# Patient Record
Sex: Female | Born: 1980
Health system: Southern US, Community
[De-identification: ages and names within clinical notes are randomized; demographics above are authoritative.]

## PROBLEM LIST (undated history)

## (undated) DIAGNOSIS — D649 Anemia, unspecified: Secondary | ICD-10-CM

## (undated) DIAGNOSIS — I10 Essential (primary) hypertension: Secondary | ICD-10-CM

## (undated) HISTORY — PX: BRAIN SURGERY: SHX531

## (undated) HISTORY — DX: Essential (primary) hypertension: I10

## (undated) HISTORY — DX: Anemia, unspecified: D64.9

---

## 2007-01-29 ENCOUNTER — Ambulatory Visit: Payer: Self-pay | Admitting: Family Medicine

## 2007-01-29 DIAGNOSIS — I10 Essential (primary) hypertension: Secondary | ICD-10-CM | POA: Insufficient documentation

## 2007-02-02 ENCOUNTER — Telehealth (INDEPENDENT_AMBULATORY_CARE_PROVIDER_SITE_OTHER): Payer: Self-pay | Admitting: *Deleted

## 2007-02-22 ENCOUNTER — Encounter (INDEPENDENT_AMBULATORY_CARE_PROVIDER_SITE_OTHER): Payer: Self-pay | Admitting: Family Medicine

## 2007-02-22 ENCOUNTER — Encounter: Admission: RE | Admit: 2007-02-22 | Discharge: 2007-05-17 | Payer: Self-pay | Admitting: Family Medicine

## 2007-02-22 ENCOUNTER — Encounter: Admission: RE | Admit: 2007-02-22 | Discharge: 2007-03-12 | Payer: Self-pay | Admitting: Family Medicine

## 2007-02-26 ENCOUNTER — Emergency Department (HOSPITAL_COMMUNITY): Admission: EM | Admit: 2007-02-26 | Discharge: 2007-02-27 | Payer: Self-pay | Admitting: Emergency Medicine

## 2007-04-02 ENCOUNTER — Ambulatory Visit: Payer: Self-pay | Admitting: Family Medicine

## 2007-04-06 LAB — CONVERTED CEMR LAB
BUN: 5 mg/dL — ABNORMAL LOW (ref 6–23)
CO2: 28 meq/L (ref 19–32)
Calcium: 9.3 mg/dL (ref 8.4–10.5)
Chloride: 105 meq/L (ref 96–112)
Creatinine, Ser: 0.8 mg/dL (ref 0.4–1.2)
Glucose, Bld: 96 mg/dL (ref 70–99)
Sodium: 137 meq/L (ref 135–145)

## 2007-04-07 ENCOUNTER — Telehealth (INDEPENDENT_AMBULATORY_CARE_PROVIDER_SITE_OTHER): Payer: Self-pay | Admitting: *Deleted

## 2007-04-16 ENCOUNTER — Telehealth (INDEPENDENT_AMBULATORY_CARE_PROVIDER_SITE_OTHER): Payer: Self-pay | Admitting: *Deleted

## 2007-04-16 ENCOUNTER — Ambulatory Visit: Payer: Self-pay | Admitting: Family Medicine

## 2007-04-16 ENCOUNTER — Encounter (INDEPENDENT_AMBULATORY_CARE_PROVIDER_SITE_OTHER): Payer: Self-pay | Admitting: *Deleted

## 2007-04-21 ENCOUNTER — Telehealth (INDEPENDENT_AMBULATORY_CARE_PROVIDER_SITE_OTHER): Payer: Self-pay | Admitting: *Deleted

## 2007-04-23 ENCOUNTER — Ambulatory Visit: Payer: Self-pay | Admitting: Family Medicine

## 2007-04-23 ENCOUNTER — Encounter: Admission: RE | Admit: 2007-04-23 | Discharge: 2007-04-23 | Payer: Self-pay | Admitting: Family Medicine

## 2007-04-23 ENCOUNTER — Telehealth (INDEPENDENT_AMBULATORY_CARE_PROVIDER_SITE_OTHER): Payer: Self-pay | Admitting: Family Medicine

## 2007-04-23 ENCOUNTER — Telehealth (INDEPENDENT_AMBULATORY_CARE_PROVIDER_SITE_OTHER): Payer: Self-pay | Admitting: *Deleted

## 2007-04-23 DIAGNOSIS — R079 Chest pain, unspecified: Secondary | ICD-10-CM | POA: Insufficient documentation

## 2007-04-23 LAB — CONVERTED CEMR LAB
BUN: 4 mg/dL — ABNORMAL LOW (ref 6–23)
CO2: 28 meq/L (ref 19–32)
GFR calc non Af Amer: 80 mL/min
Glucose, Bld: 89 mg/dL (ref 70–99)
Potassium: 3.5 meq/L (ref 3.5–5.1)

## 2007-06-25 ENCOUNTER — Ambulatory Visit: Payer: Self-pay | Admitting: Family Medicine

## 2007-06-25 ENCOUNTER — Encounter (INDEPENDENT_AMBULATORY_CARE_PROVIDER_SITE_OTHER): Payer: Self-pay | Admitting: *Deleted

## 2007-06-25 LAB — CONVERTED CEMR LAB
BUN: 5 mg/dL — ABNORMAL LOW (ref 6–23)
Calcium: 9.8 mg/dL (ref 8.4–10.5)
Cholesterol: 196 mg/dL (ref 0–200)
GFR calc non Af Amer: 80 mL/min
HDL: 43.6 mg/dL (ref >39.0)
LDL Cholesterol: 130 mg/dL — ABNORMAL HIGH (ref 0–99)
Potassium: 4.3 meq/L (ref 3.5–5.1)
Total CHOL/HDL Ratio: 4.5
Triglycerides: 110 mg/dL (ref 0–149)
VLDL: 22 mg/dL (ref 0–40)

## 2007-07-27 ENCOUNTER — Telehealth (INDEPENDENT_AMBULATORY_CARE_PROVIDER_SITE_OTHER): Payer: Self-pay | Admitting: Family Medicine

## 2007-07-30 ENCOUNTER — Telehealth (INDEPENDENT_AMBULATORY_CARE_PROVIDER_SITE_OTHER): Payer: Self-pay | Admitting: *Deleted

## 2007-07-30 ENCOUNTER — Encounter (INDEPENDENT_AMBULATORY_CARE_PROVIDER_SITE_OTHER): Payer: Self-pay | Admitting: Family Medicine

## 2007-08-02 ENCOUNTER — Ambulatory Visit: Payer: Self-pay | Admitting: Family Medicine

## 2007-08-03 ENCOUNTER — Encounter (INDEPENDENT_AMBULATORY_CARE_PROVIDER_SITE_OTHER): Payer: Self-pay | Admitting: Family Medicine

## 2007-08-13 ENCOUNTER — Ambulatory Visit: Payer: Self-pay | Admitting: Cardiology

## 2007-08-13 LAB — CONVERTED CEMR LAB
CO2: 30 meq/L (ref 19–32)
Chloride: 105 meq/L (ref 96–112)
GFR calc non Af Amer: 79 mL/min
Glucose, Bld: 131 mg/dL — ABNORMAL HIGH (ref 70–99)
Potassium: 3.4 meq/L — ABNORMAL LOW (ref 3.5–5.1)
Sodium: 140 meq/L (ref 135–145)
TSH: 1.23 microintl units/mL (ref 0.35–5.50)

## 2007-08-27 ENCOUNTER — Ambulatory Visit: Payer: Self-pay | Admitting: Cardiology

## 2007-08-27 LAB — CONVERTED CEMR LAB
CO2: 31 meq/L (ref 19–32)
GFR calc Af Amer: 96 mL/min
GFR calc non Af Amer: 79 mL/min
Glucose, Bld: 78 mg/dL (ref 70–99)
Sodium: 136 meq/L (ref 135–145)

## 2007-08-31 LAB — CONVERTED CEMR LAB: Pap Smear: NORMAL

## 2007-11-25 ENCOUNTER — Telehealth (INDEPENDENT_AMBULATORY_CARE_PROVIDER_SITE_OTHER): Payer: Self-pay | Admitting: *Deleted

## 2008-01-27 ENCOUNTER — Ambulatory Visit: Payer: Self-pay | Admitting: Internal Medicine

## 2008-01-27 DIAGNOSIS — B009 Herpesviral infection, unspecified: Secondary | ICD-10-CM | POA: Insufficient documentation

## 2008-11-17 ENCOUNTER — Ambulatory Visit: Payer: Self-pay | Admitting: Family Medicine

## 2008-11-17 DIAGNOSIS — E669 Obesity, unspecified: Secondary | ICD-10-CM | POA: Insufficient documentation

## 2008-11-20 LAB — CONVERTED CEMR LAB
Calcium: 9.1 mg/dL (ref 8.4–10.5)
Chloride: 106 meq/L (ref 96–112)
Creatinine, Ser: 0.9 mg/dL (ref 0.4–1.2)

## 2009-03-19 ENCOUNTER — Ambulatory Visit: Payer: Self-pay | Admitting: Family Medicine

## 2009-03-19 DIAGNOSIS — H9390 Unspecified disorder of ear, unspecified ear: Secondary | ICD-10-CM | POA: Insufficient documentation

## 2009-03-19 DIAGNOSIS — M549 Dorsalgia, unspecified: Secondary | ICD-10-CM | POA: Insufficient documentation

## 2009-03-22 ENCOUNTER — Ambulatory Visit: Payer: Self-pay | Admitting: Family Medicine

## 2009-03-23 ENCOUNTER — Telehealth (INDEPENDENT_AMBULATORY_CARE_PROVIDER_SITE_OTHER): Payer: Self-pay | Admitting: *Deleted

## 2009-03-23 LAB — CONVERTED CEMR LAB
Basophils Absolute: 0.1 10*3/uL (ref 0.0–0.1)
Basophils Relative: 0.8 % (ref 0.0–3.0)
HCT: 31.3 % — ABNORMAL LOW (ref 36.0–46.0)
Lymphocytes Relative: 33.2 % (ref 12.0–46.0)
MCHC: 30.3 g/dL (ref 30.0–36.0)
Monocytes Relative: 11.4 % (ref 3.0–12.0)
Neutro Abs: 4.2 10*3/uL (ref 1.4–7.7)
RBC: 4.37 M/uL (ref 3.87–5.11)
RDW: 17.4 % — ABNORMAL HIGH (ref 11.5–14.6)
WBC: 8 10*3/uL (ref 4.5–10.5)

## 2009-03-26 ENCOUNTER — Encounter: Payer: Self-pay | Admitting: Family Medicine

## 2009-03-26 ENCOUNTER — Ambulatory Visit: Payer: Self-pay | Admitting: Family Medicine

## 2009-04-06 ENCOUNTER — Telehealth: Payer: Self-pay | Admitting: Family Medicine

## 2009-04-09 ENCOUNTER — Ambulatory Visit: Payer: Self-pay | Admitting: Family Medicine

## 2009-04-09 DIAGNOSIS — D500 Iron deficiency anemia secondary to blood loss (chronic): Secondary | ICD-10-CM | POA: Insufficient documentation

## 2009-04-09 LAB — CONVERTED CEMR LAB: Hgb A: 98 % — ABNORMAL HIGH (ref 96.8–97.8)

## 2009-04-10 ENCOUNTER — Encounter (INDEPENDENT_AMBULATORY_CARE_PROVIDER_SITE_OTHER): Payer: Self-pay | Admitting: *Deleted

## 2009-04-10 ENCOUNTER — Telehealth (INDEPENDENT_AMBULATORY_CARE_PROVIDER_SITE_OTHER): Payer: Self-pay | Admitting: *Deleted

## 2009-04-10 LAB — CONVERTED CEMR LAB
Alkaline Phosphatase: 63 units/L (ref 39–117)
BUN: 9 mg/dL (ref 6–23)
Bilirubin, Direct: 0 mg/dL (ref 0.0–0.3)
CO2: 29 meq/L (ref 19–32)
Calcium: 9.1 mg/dL (ref 8.4–10.5)
Cholesterol: 171 mg/dL (ref 0–200)
GFR calc non Af Amer: 108.51 mL/min (ref >60)
Glucose, Bld: 79 mg/dL (ref 70–99)
HDL: 38.1 mg/dL — ABNORMAL LOW (ref >39.00)
Total Bilirubin: 0.6 mg/dL (ref 0.3–1.2)
Total CHOL/HDL Ratio: 4

## 2009-04-16 ENCOUNTER — Encounter (INDEPENDENT_AMBULATORY_CARE_PROVIDER_SITE_OTHER): Payer: Self-pay | Admitting: *Deleted

## 2009-04-16 LAB — CONVERTED CEMR LAB
Eosinophils Relative: 1.9 % (ref 0.0–5.0)
Folate: 15.8 ng/mL
Hemoglobin: 10.7 g/dL — ABNORMAL LOW (ref 12.0–15.0)
Lymphocytes Relative: 38.8 % (ref 12.0–46.0)
Lymphs Abs: 2.6 10*3/uL (ref 0.7–4.0)
Monocytes Absolute: 0.7 10*3/uL (ref 0.1–1.0)
Monocytes Relative: 11 % (ref 3.0–12.0)
Neutro Abs: 3.3 10*3/uL (ref 1.4–7.7)
RBC: 4.43 M/uL (ref 3.87–5.11)
Transferrin: 311 mg/dL (ref 212.0–360.0)
WBC: 6.8 10*3/uL (ref 4.5–10.5)

## 2009-04-18 ENCOUNTER — Telehealth (INDEPENDENT_AMBULATORY_CARE_PROVIDER_SITE_OTHER): Payer: Self-pay | Admitting: *Deleted

## 2009-05-18 ENCOUNTER — Ambulatory Visit: Admission: RE | Admit: 2009-05-18 | Discharge: 2009-05-18 | Payer: Self-pay | Admitting: Obstetrics and Gynecology

## 2009-05-18 ENCOUNTER — Ambulatory Visit: Payer: Self-pay | Admitting: Vascular Surgery

## 2009-05-18 ENCOUNTER — Encounter (INDEPENDENT_AMBULATORY_CARE_PROVIDER_SITE_OTHER): Payer: Self-pay | Admitting: Obstetrics and Gynecology

## 2009-05-29 ENCOUNTER — Encounter (INDEPENDENT_AMBULATORY_CARE_PROVIDER_SITE_OTHER): Payer: Self-pay | Admitting: Family Medicine

## 2009-05-29 ENCOUNTER — Encounter: Payer: Self-pay | Admitting: Internal Medicine

## 2009-05-29 ENCOUNTER — Ambulatory Visit: Admission: RE | Admit: 2009-05-29 | Discharge: 2009-05-29 | Payer: Self-pay | Admitting: Family Medicine

## 2009-06-11 ENCOUNTER — Ambulatory Visit (HOSPITAL_COMMUNITY): Admission: RE | Admit: 2009-06-11 | Discharge: 2009-06-11 | Payer: Self-pay | Admitting: Surgery

## 2009-06-29 ENCOUNTER — Telehealth (INDEPENDENT_AMBULATORY_CARE_PROVIDER_SITE_OTHER): Payer: Self-pay | Admitting: *Deleted

## 2009-07-24 ENCOUNTER — Ambulatory Visit (HOSPITAL_COMMUNITY): Admission: RE | Admit: 2009-07-24 | Discharge: 2009-07-24 | Payer: Self-pay | Admitting: Surgery

## 2009-09-25 ENCOUNTER — Ambulatory Visit: Payer: Self-pay | Admitting: Family Medicine

## 2009-10-01 LAB — CONVERTED CEMR LAB
Basophils Absolute: 0 10*3/uL (ref 0.0–0.1)
Eosinophils Absolute: 0.1 10*3/uL (ref 0.0–0.7)
Eosinophils Relative: 0.8 % (ref 0.0–5.0)
Iron: 89 ug/dL (ref 42–145)
MCHC: 32.4 g/dL (ref 30.0–36.0)
Monocytes Absolute: 0.7 10*3/uL (ref 0.1–1.0)
Monocytes Relative: 10.5 % (ref 3.0–12.0)
RDW: 16.9 % — ABNORMAL HIGH (ref 11.5–14.6)
Saturation Ratios: 22.7 % (ref 20.0–50.0)
WBC: 6.9 10*3/uL (ref 4.5–10.5)

## 2010-07-04 ENCOUNTER — Ambulatory Visit
Admission: RE | Admit: 2010-07-04 | Discharge: 2010-07-04 | Payer: Self-pay | Source: Home / Self Care | Attending: Family Medicine | Admitting: Family Medicine

## 2010-07-09 NOTE — Progress Notes (Signed)
Summary: NEEDS REFILL FOR GENERIC LOTENSIN  Phone Note Call from Patient Call back at [phone]==CELL   Caller: Patient Summary of Call: PATIENT NEEDS REFILL FOR THE GENERIC FOR [facility]IN [facility]  30 DAY SUPPLY OR CAN SHE GET A LARGER REFILL? Initial call taken by: Jerolyn Shin,  June 29, 2009 4:50 PM  Follow-up for Phone Call        pt informed rx faxed .Kandice Hams  June 29, 2009 5:02 PM  Follow-up by: Kandice Hams,  June 29, 2009 5:02 PM    Prescriptions: LOTENSIN HCT 20-25 MG  TABS (BENAZEPRIL-HYDROCHLOROTHIAZIDE) Take one tablet daily  #90 x 1   Entered by:   Kandice Hams   Authorized by:   Loreen Freud DO   Signed by:   Kandice Hams on 06/29/2009   Method used:   [facility]axed to ...       [facility] [phone]* (retail)       [address]       [place], [place]  [address]       Ph: [phone]       Fax: [phone]   RxID:   [id number]

## 2010-07-09 NOTE — Assessment & Plan Note (Signed)
Summary: talk about low RBC's//lch   Vital Signs:  Patient profile:   30 year old female Height:      67.5 inches Weight:      205.8 pounds BMI:     31.87 Pulse rate:   70 / minute BP sitting:   120 / 80  (left arm)  Vitals Entered By: Doristine Devoid (September 25, 2009 11:04 AM) CC: discuss low iron   History of Present Illness: 30 yo woman here today to discuss anemia.  went to a doctor to discuss HCG diet and blood work.  was told that her RBC count was 'up by 1' but she's not sure what that means.  admits to feeling light headed at the end of the day, no dizziness the rest of the day.  had stopped iron due to constipation.  restarted last week.  would like labs re-drawn.  Current Medications (verified): 1)  Lotensin Hct 20-25 Mg  Tabs (Benazepril-Hydrochlorothiazide) .... Take One Tablet Daily 2)  Valtrex 1 Gm  Tabs (Valacyclovir Hcl) .... One By Mouth Once Daily X 7 Days 3)  Ferrous Sulfate 325 (65 Fe) Mg Tbec (Ferrous Sulfate) .... Take One Tablet Daily 4)  Yasmin 28 3-0.03 Mg Tabs (Drospirenone-Ethinyl Estradiol) .... As Directed  Allergies (verified): No Known Drug Allergies  Past History:  Past Medical History: Hypertension Anemia  Review of Systems      See HPI  Physical Exam  General:  Well-developed,well-nourished,in no acute distress; alert,appropriate and cooperative throughout examination Eyes:  mild conjunctival pallor Lungs:  Normal respiratory effort, chest expands symmetrically. Lungs are clear to auscultation, no crackles or wheezes. Heart:  normal rate and no murmur.     Impression & Recommendations:  Problem # 1:  IRON DEFICIENCY ANEMIA SECONDARY TO BLOOD LOSS (ICD-280.0) Assessment Unchanged due for recheck on labs.  encouraged pt to continue iron and use OTC stool softeners as needed.  will follow. Her updated medication list for this problem includes:    Ferrous Sulfate 325 (65 Fe) Mg Tbec (Ferrous sulfate) .Marland Kitchen... Take one tablet  daily  Orders: Venipuncture (16109) TLB-CBC Platelet - w/Differential (85025-CBCD) TLB-IBC Pnl (Iron/FE;Transferrin) (83550-IBC)  Problem # 2:  OBESITY (ICD-278.00) Assessment: Unchanged pt has lost 21 lbs on weight watchers and w/ regular exercise.  applauded pt's efforts and encouraged her to continue.  Complete Medication List: 1)  Lotensin Hct 20-25 Mg Tabs (Benazepril-hydrochlorothiazide) .... Take one tablet daily 2)  Valtrex 1 Gm Tabs (Valacyclovir hcl) .... One by mouth once daily x 7 days 3)  Ferrous Sulfate 325 (65 Fe) Mg Tbec (Ferrous sulfate) .... Take one tablet daily 4)  Yasmin 28 3-0.03 Mg Tabs (Drospirenone-ethinyl estradiol) .... As directed  Patient Instructions: 1)  Plan on a physical in fall of 2011- don't eat before that appt 2)  Certainly call with any questions or concerns you may have 3)  Keep up the good work on diet and exercise- you look fantastic!!! 4)  We'll notify you of your lab work 5)  Happy Easter! Prescriptions: LOTENSIN HCT 20-25 MG  TABS (BENAZEPRIL-HYDROCHLOROTHIAZIDE) Take one tablet daily  #90 x 3   Entered and Authorized by:   Neena Rhymes MD   Signed by:   Neena Rhymes MD on 09/25/2009   Method used:   [facility]ically to        [facility] [phone]* (retail)       [address] [facility] [address] [facility]       [place],  [place]  [address]       Ph: [phone]       Fax: [phone]   RxID:   [id number]

## 2010-07-09 NOTE — Consult Note (Signed)
Summary: [facility]  [facility]   Imported By: Lanelle Bal 06/13/2009 12:45:56  _____________________________________________________________________  External Attachment:    Type:   Image     Comment:   External Document

## 2010-07-17 NOTE — Assessment & Plan Note (Signed)
Summary: check bp//lch   Vital Signs:  Patient profile:   30 year old female Weight:      214 pounds BMI:     33.14 Pulse rate:   110 / minute BP sitting:   130 / 80  (left arm)  Vitals Entered By: Doristine Devoid CMA (July 04, 2010 2:48 PM) CC: f/u on BP has been up and down since holiday    History of Present Illness: 30 yo woman here for elevated BPs.  sxs started in Nov w/ diastolic value of 110.  at [facility] BP was 160/110.  UC added Norvasc.  since adding Norvasc the highest diastolic # has been 97.  at night when pt is relaxed DBP is in the 70s.  will get 'cloudy- headed' when BP is elevated.  increased stress- sister's husband died after house burnt down, nephew nearly died.  pt admits to not taking care of self due to caring for sister.  Current Medications (verified): 1)  Lotensin Hct 20-12.5 Mg Tabs (Benazepril-Hydrochlorothiazide) .... 2 Tabs Daily 2)  Valtrex 1 Gm  Tabs (Valacyclovir Hcl) .... One By Mouth Once Daily X 7 Days 3)  Ferrous Sulfate 325 (65 Fe) Mg Tbec (Ferrous Sulfate) .... Take One Tablet Daily 4)  Yasmin 28 3-0.03 Mg Tabs (Drospirenone-Ethinyl Estradiol) .... As Directed 5)  Norvasc 10 Mg Tabs (Amlodipine Besylate) .... Take One Tablet Daily  Allergies (verified): No Known Drug Allergies  Review of Systems      See HPI  Physical Exam  General:  Well-developed,well-nourished,in no acute distress; alert,appropriate and cooperative throughout examination Head:  Normocephalic and atraumatic without obvious abnormalities. No apparent alopecia or balding. Eyes:  No corneal or conjunctival inflammation noted. EOMI. Perrla. Funduscopic exam benign, without hemorrhages, exudates or papilledema. Vision grossly normal. Neck:  No deformities, masses, or tenderness noted. Lungs:  Normal respiratory effort, chest expands symmetrically. Lungs are clear to auscultation, no crackles or wheezes. Heart:  normal rate and no murmur.   Pulses:  +2 carotid, radial,  DP Extremities:  no C/C/E   Impression & Recommendations:  Problem # 1:  HYPERTENSION (ICD-401.9) Assessment Deteriorated given pt's fluctuating BP and sxs will increase Benazepril component to 40 mg daily.  continue Norvasc.  as pt's stress level and weight decline will adjust meds as needed.  reviewed supportive care and red flags that should prompt return.  Pt expresses understanding and is in agreement w/ this plan. Her updated medication list for this problem includes:    Lotensin Hct 20-12.5 Mg Tabs (Benazepril-hydrochlorothiazide) .Marland Kitchen... 2 tabs daily    Norvasc 10 Mg Tabs (Amlodipine besylate) .Marland Kitchen... Take one tablet daily  Complete Medication List: 1)  Lotensin Hct 20-12.5 Mg Tabs (Benazepril-hydrochlorothiazide) .... 2 tabs daily 2)  Valtrex 1 Gm Tabs (Valacyclovir hcl) .... One by mouth once daily x 7 days 3)  Ferrous Sulfate 325 (65 Fe) Mg Tbec (Ferrous sulfate) .... Take one tablet daily 4)  Yasmin 28 3-0.03 Mg Tabs (Drospirenone-ethinyl estradiol) .... As directed 5)  Norvasc 10 Mg Tabs (Amlodipine besylate) .... Take one tablet daily  Patient Instructions: 1)  Follow up in 3-4 weeks to recheck your blood pressure 2)  When you pick up the new Lotensin- take 2 daily 3)  Continue the Norvasc 4)  Drink plenty of fluids 5)  Keep up the good work on your exercise- i'm proud of you! 6)  Hang in there!!! Prescriptions: NORVASC 10 MG TABS (AMLODIPINE BESYLATE) take one tablet daily  #30 x 3   Entered  and Authorized by:   Neena Rhymes MD   Signed by:   Neena Rhymes MD on 07/04/2010   Method used:   Electronically to        [facility] [phone]* (retail)       [address]       [place], [place]  [address]       Ph: [phone]       Fax: [phone]   RxID:   [id number] LOTENSIN HCT 20-12.5 MG TABS (BENAZEPRIL-HYDROCHLOROTHIAZIDE) 2 tabs daily  #60 x 3   Entered and Authorized by:   Neena Rhymes MD   Signed by:   Neena Rhymes MD on 07/04/2010   Method used:   Electronically to        [facility] [phone]* (retail)       [address]       [place], [place]  [address]       Ph: [phone]       Fax: [phone]   RxID:   [id number]    Orders Added: 1)  Est. Patient Level III [30160]

## 2010-08-02 ENCOUNTER — Ambulatory Visit: Payer: Self-pay | Admitting: Family Medicine

## 2010-08-05 ENCOUNTER — Encounter: Payer: Self-pay | Admitting: Family Medicine

## 2010-08-05 ENCOUNTER — Ambulatory Visit: Payer: Self-pay | Admitting: Family Medicine

## 2010-08-05 ENCOUNTER — Other Ambulatory Visit: Payer: Self-pay | Admitting: Family Medicine

## 2010-08-05 ENCOUNTER — Ambulatory Visit (INDEPENDENT_AMBULATORY_CARE_PROVIDER_SITE_OTHER): Payer: No Typology Code available for payment source | Admitting: Family Medicine

## 2010-08-05 DIAGNOSIS — I10 Essential (primary) hypertension: Secondary | ICD-10-CM

## 2010-08-05 DIAGNOSIS — D500 Iron deficiency anemia secondary to blood loss (chronic): Secondary | ICD-10-CM

## 2010-08-05 LAB — BASIC METABOLIC PANEL
BUN: 7 mg/dL (ref 6–23)
Creatinine, Ser: 0.8 mg/dL (ref 0.4–1.2)
GFR: 110.71 mL/min (ref >60.00)
Sodium: 138 mEq/L (ref 135–145)

## 2010-08-05 LAB — CBC WITH DIFFERENTIAL/PLATELET
Eosinophils Absolute: 0.1 10*3/uL (ref 0.0–0.7)
HCT: 41.6 % (ref 36.0–46.0)
Lymphs Abs: 2.2 10*3/uL (ref 0.7–4.0)
Monocytes Relative: 7.3 % (ref 3.0–12.0)
Neutro Abs: 3.9 10*3/uL (ref 1.4–7.7)
Neutrophils Relative %: 58 % (ref 43.0–77.0)
Platelets: 349 10*3/uL (ref 150.0–400.0)
RDW: 12.8 % (ref 11.5–14.6)
WBC: 6.7 10*3/uL (ref 4.5–10.5)

## 2010-08-05 LAB — IBC PANEL: Iron: 94 ug/dL (ref 42–145)

## 2010-08-15 NOTE — Assessment & Plan Note (Signed)
Summary: follow-up//ph   Vital Signs:  Patient profile:   30 year old female Height:      67.5 inches Weight:      215 pounds Temp:     98.1 degrees F oral Pulse rate:   92 / minute BP sitting:   160 / 66  (left arm)  Vitals Entered By: Jeremy Johann CMA (August 05, 2010 11:25 AM)  Serial Vital Signs/Assessments:  Time      Position  BP       Pulse  Resp  Temp     By                     138/70                         Frances Rhymes MD  CC: Follow-up visit BP check, recheck iron level   History of Present Illness: 30 yo woman here today for  HTN- last night BP was '115/70 something'.  reports  BP has been better since increasing Lotensin.  states that whenever she eats salt can feel BP increasing.  no CP, SOB, edema, visual changes.  'minor headaches'.  anemia- off iron and OCPs.  no longer having heavy periods.  would like iron rechecked today.  Current Medications (verified): 1)  Lotensin Hct 20-12.5 Mg Tabs (Benazepril-Hydrochlorothiazide) .... 2 Tabs Daily 2)  Ferrous Sulfate 325 (65 Fe) Mg Tbec (Ferrous Sulfate) .... Take One Tablet Daily 3)  Norvasc 10 Mg Tabs (Amlodipine Besylate) .... Take One Tablet Daily  Allergies (verified): No Known Drug Allergies  Review of Systems      See HPI  Physical Exam  General:  Well-developed,well-nourished,in no acute distress; alert,appropriate and cooperative throughout examination Head:  Normocephalic and atraumatic without obvious abnormalities. No apparent alopecia or balding. Eyes:  mild conjunctival pallor Neck:  No deformities, masses, or tenderness noted. Lungs:  Normal respiratory effort, chest expands symmetrically. Lungs are clear to auscultation, no crackles or wheezes. Heart:  normal rate and no murmur.   Pulses:  +2 carotid, radial, DP Extremities:  no C/C/E   Impression & Recommendations:  Problem # 1:  HYPERTENSION (ICD-401.9) Assessment Unchanged BP fairly well controlled.  pt seems to be  especially sensitive to salt.  continue current meds and f/u in 3 months.  pt to call sooner if BP elevated.  reviewed supportive care and red flags that should prompt return.  Pt expresses understanding and is in agreement w/ this plan. Her updated medication list for this problem includes:    Lotensin Hct 20-12.5 Mg Tabs (Benazepril-hydrochlorothiazide) .Marland Kitchen... 2 tabs daily    Norvasc 10 Mg Tabs (Amlodipine besylate) .Marland Kitchen... Take one tablet daily  Orders: Venipuncture (16109) TLB-BMP (Basic Metabolic Panel-BMET) (80048-METABOL) Specimen Handling (60454)  Problem # 2:  IRON DEFICIENCY ANEMIA SECONDARY TO BLOOD LOSS (ICD-280.0) Assessment: Unchanged pt no longer on OCPs or iron.  reports periods are now shorter and lighter.  will check labs to assess for ongoing need for iron. Her updated medication list for this problem includes:    Ferrous Sulfate 325 (65 Fe) Mg Tbec (Ferrous sulfate) .Marland Kitchen... Take one tablet daily  Orders: TLB-CBC Platelet - w/Differential (85025-CBCD) TLB-IBC Pnl (Iron/FE;Transferrin) (83550-IBC) Specimen Handling (09811)  Complete Medication List: 1)  Lotensin Hct 20-12.5 Mg Tabs (Benazepril-hydrochlorothiazide) .... 2 tabs daily 2)  Ferrous Sulfate 325 (65 Fe) Mg Tbec (Ferrous sulfate) .... Take one tablet daily 3)  Norvasc 10 Mg Tabs (Amlodipine besylate) .Marland KitchenMarland KitchenMarland Kitchen  Take one tablet daily  Patient Instructions: 1)  Please schedule a follow-up appointment in 3 months to recheck blood pressure. 2)  Continue your current meds.  Call me if your home blood pressures are consistently >140/90 3)  Limit your salt 4)  Keep up the good work on diet and exercise 5)  We'll notify you of your lab results 6)  Call with any questions or concerns 7)  Hang in there!!    Orders Added: 1)  Venipuncture [36415] 2)  TLB-BMP (Basic Metabolic Panel-BMET) [80048-METABOL] 3)  TLB-CBC Platelet - w/Differential [85025-CBCD] 4)  TLB-IBC Pnl (Iron/FE;Transferrin) [83550-IBC] 5)  Specimen  Handling [99000] 6)  Est. Patient Level III [40102]    Prevention & Chronic Care Immunizations   Influenza vaccine: Not documented    Tetanus booster: 12/21/2006: given   Tetanus booster due: 12/20/2016    Pneumococcal vaccine: Not documented  Other Screening   Pap smear: normal  (08/31/2007)   Pap smear due: 08/30/2008   Smoking status: never  (04/09/2009)  Hypertension   Last Blood Pressure: 160 / 66  (08/05/2010)   Serum creatinine: 0.8  (03/22/2009)   Serum potassium 3.8  (03/22/2009)  Self-Management Support :    Hypertension self-management support: Not documented

## 2010-08-29 LAB — CBC
HCT: 39.2 % (ref 36.0–46.0)
Hemoglobin: 12.9 g/dL (ref 12.0–15.0)
Platelets: 311 10*3/uL (ref 150–400)
RDW: 18.7 % — ABNORMAL HIGH (ref 11.5–15.5)
WBC: 5.3 10*3/uL (ref 4.0–10.5)

## 2010-10-22 NOTE — Assessment & Plan Note (Signed)
[facility]                            CARDIOLOGY OFFICE NOTE   JACALYNN, BUZZELL                      MRN:          [id number]  DATE:08/13/2007                            DOB:          1980-07-13    Ms. Flavell is a very pleasant 30 year old female who I am asked to  evaluate for chest pain and hypertension.  The patient has no prior  cardiac history.  She does exercise.  She denies any dyspnea on  exertion, orthopnea, PND, pedal edema, palpitations, presyncope,  syncope, or chest pain.  She has had hypertension for approximately 9  years.  Back in November, her Lotensin HCT, which had worked well  before, was discontinued.  She was having chest heaviness.  The pain had  been present for 2 weeks.  It was not pleuritic or positional nor was it  related to food.  There were no associated symptoms including no nausea,  vomiting, shortness breath, or diaphoresis.  She states the pain did  worsen when she took her bra off.  It ultimately resolved.  Her Lotensin  HCT was changed to Norvasc.  However, her blood pressure was elevated,  and she has had to take trips to the emergency room because of that.  She was subsequently changed from Norvasc to metoprolol 25 mg p.o.  daily.  However, her blood pressure continues to be high.  She therefore  on her own 2 weeks ago changed back to Lotensin HCT, and her blood  pressure has been controlled, with systolics in 120-130 range and  diastolics in the 60-70 range.  Because of her blood pressure and chest  pain, we were asked to further evaluate.   CURRENT MEDICATIONS:  1. Lotensin HCT 20/25 mg p.o. daily.  2. Berry juice.   ALLERGIES:  NO KNOWN DRUG ALLERGIES.   SOCIAL HISTORY:  She does not smoke nor does she consume alcohol.  There  is no drug use.   FAMILY HISTORY:  Significant for hypertension in her father, but there  is no coronary artery disease.   PAST MEDICAL HISTORY:  1. Hypertension.  2.  Mild hyperlipidemia.  3. There is no diabetes mellitus.   PAST SURGICAL HISTORY:  She has had previous cysts removed under her  arms bilaterally.  There are no other surgeries noted.   REVIEW OF SYSTEMS:  She denies any headaches or fevers, chills.  There  is no productive cough or hemoptysis.  There is no dysphagia or  odynophagia, melena, or hematochezia.  There is no dysuria or hematuria.  There is no rash or seizure activity.  There is orthopnea, PND, or pedal  edema.  There is no claudication noted.  She has noticed some tingling  in her left hand, but she attributes this to excessive use with counting  money, as she is an Product/process development scientist.  The remaining systems are negative.   PHYSICAL EXAMINATION:  GENERAL:  She is well-developed and somewhat  obese.  She is no acute distress at present.  She is mildly anxious.  VITAL SIGNS:  She is 215 pounds.  Blood pressure 164/98, and  her pulse  is 119.  BACK:  Normal.  HEENT:  Normal with normal eyelids.  NECK:  Supple with normal upstroke bilaterally, and there are no bruits  noted.  There is no jugular venous distention.  On palpation, there is  potential mild thyromegaly.  I cannot palpate nodules.  CHEST:  Clear to auscultation with normal expansion.  CARDIOVASCULAR:  Tachycardic rate and regular rhythm.  There is a 1/6  systolic ejection murmur at the left sternal border.  There is no S3 or  S4.  ABDOMEN:  Nontender, nondistended.  Positive bowel sounds.  No  hepatosplenomegaly.  No mass appreciated.  No abdominal bruit.  EXTREMITIES:  She has 2+ femoral pulses bilaterally and no bruits.  No  edema.  I could palpate no cords.  She has 2+ posterior tibial pulses  bilaterally.  NEUROLOGIC:  Grossly intact.   Her electrocardiogram shows sinus tachycardia, rate of 119.  There are  no significant ST changes.   DIAGNOSES:  1. Atypical chest pain.  Her symptoms are not cardiac in etiology.      There were persistent for 2 weeks, and then they  worsened with      removing her bra.  Electrocardiogram is normal.  Note, she does not      have exertional shortness of breath or chest pain.  Will not pursue      further ischemia evaluation.  2. Hypertension.  Her blood pressure apparently is much better      controlled now that she is on her Lotensin HCT.  She tracks her      blood pressure at home, and we will continue to monitor this.  She      states it is now in the 120-130 range systolic with a diastolic of      16-10.  We will check a basic metabolic panel to follow potassium      and renal function, and we will discontinue her amlodipine and      metoprolol.  I think if her blood pressure is adequately controlled      with the above medication, then this should be sufficient.  I do      not think it caused her chest pain.  Note, we will check a thyroid-      stimulating hormone, given her elevated heart rate and question      enlarged thyroid on exam.  I have asked her to follow up with Dr.      Blossom Hoops or Dr. Drue Novel for her blood pressure as well as further      evaluation of her thyroid.  3. Question enlarged thyroid on examination.  As per above, we have      asked her to follow up Dr. Drue Novel or Dr. Blossom Hoops concerning this      issue.   I have instructed her to return to see [place] on as-needed basis.  We are  correlating her cuff that she uses at home with ours today to see if it  is accurate.  Again, her blood pressure appears to be controlled on  Lotensin HCT.     Madolyn Frieze Jens Som, MD, [facility]  Electronically Signed    BSC/MedQ  DD: 08/13/2007  DT: 08/14/2007  Job #: [id number]   cc:   Leanne Chang, M.D.

## 2010-10-24 ENCOUNTER — Encounter: Payer: Self-pay | Admitting: Family Medicine

## 2010-11-01 ENCOUNTER — Other Ambulatory Visit: Payer: Self-pay | Admitting: Family Medicine

## 2010-11-01 ENCOUNTER — Telehealth: Payer: Self-pay | Admitting: Family Medicine

## 2010-11-01 NOTE — Telephone Encounter (Signed)
Rx sent 

## 2010-11-01 NOTE — Telephone Encounter (Signed)
[facility] - she needs refill benazipril

## 2010-11-08 ENCOUNTER — Encounter: Payer: No Typology Code available for payment source | Admitting: Family Medicine

## 2010-11-30 ENCOUNTER — Other Ambulatory Visit: Payer: Self-pay | Admitting: Family Medicine

## 2010-12-02 NOTE — Telephone Encounter (Signed)
Refill sent. Per centricity pt is due for CPX, sent note.

## 2010-12-26 ENCOUNTER — Other Ambulatory Visit: Payer: Self-pay | Admitting: Family Medicine

## 2010-12-26 NOTE — Telephone Encounter (Signed)
Refill sent, needs ov. 

## 2011-01-27 ENCOUNTER — Other Ambulatory Visit: Payer: Self-pay | Admitting: Family Medicine

## 2011-01-30 ENCOUNTER — Other Ambulatory Visit: Payer: Self-pay | Admitting: Family Medicine

## 2011-02-03 IMAGING — CT CT GUIDANCE NEEDLE PLACEMENT
2 of 3 series · 15 of 32 positions shown, 20 images · non-contrast
Comparison: None.

CLINICAL DATA: Recurrent cystic lesion cephalad to the urinary
bladder.

CT GUIDANCE NEEDLE ASPIRATION PELVIS

[Series 2: rtn ap without · axial · non-contrast · 0.74mm/px · z∈[-216,-26]mm · 7 of 52 slices shown, 12 images]
[im 7/52  soft-tissue]
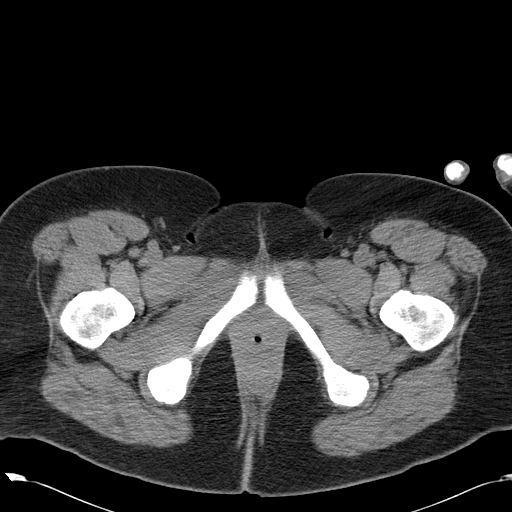
[im 7/52  bone]
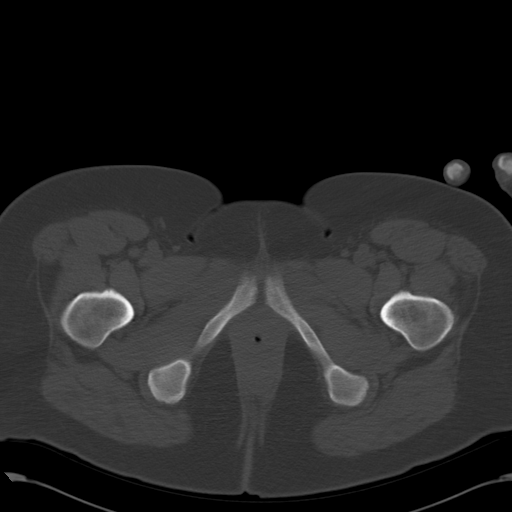
[im 13/52  soft-tissue]
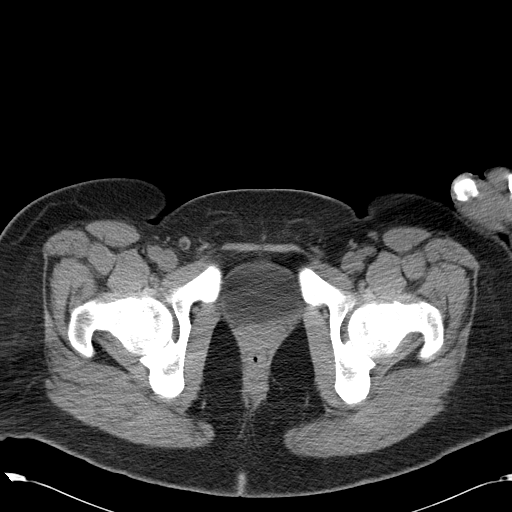
[im 20/52  soft-tissue]
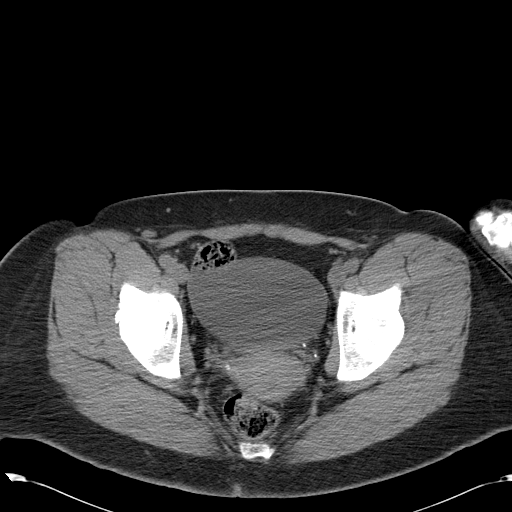
[im 26/52  soft-tissue]
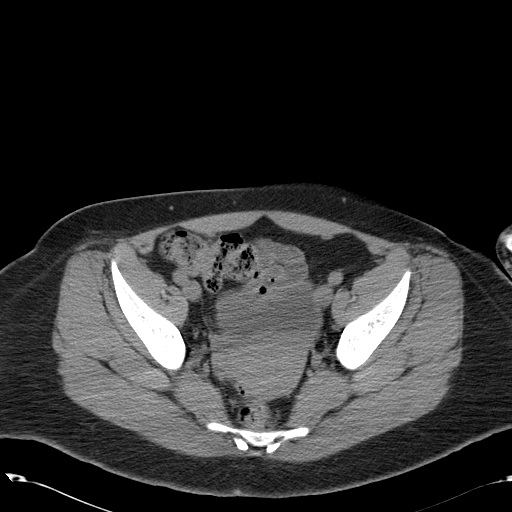
[im 26/52  lung]
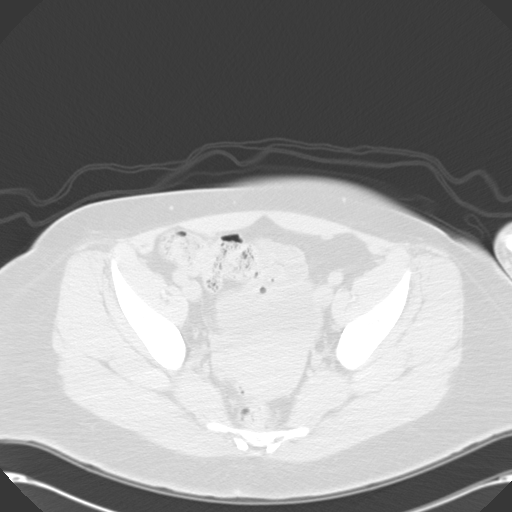
[im 32/52  soft-tissue]
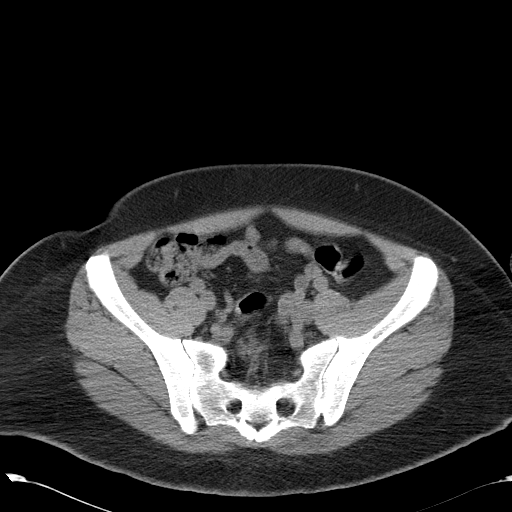
[im 32/52  lung]
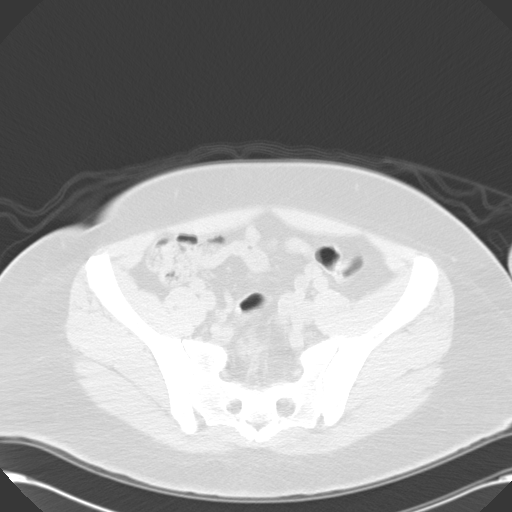
[im 39/52  soft-tissue]
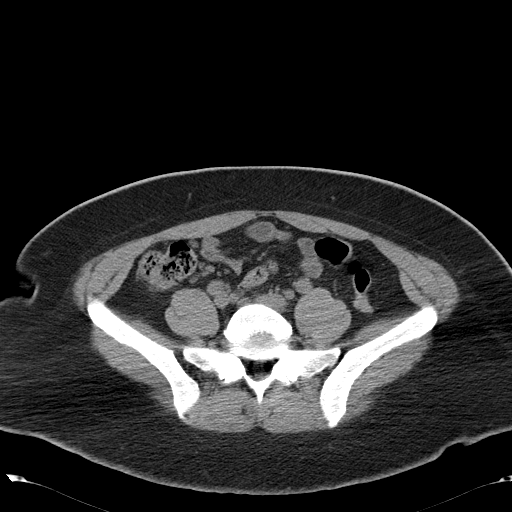
[im 39/52  lung]
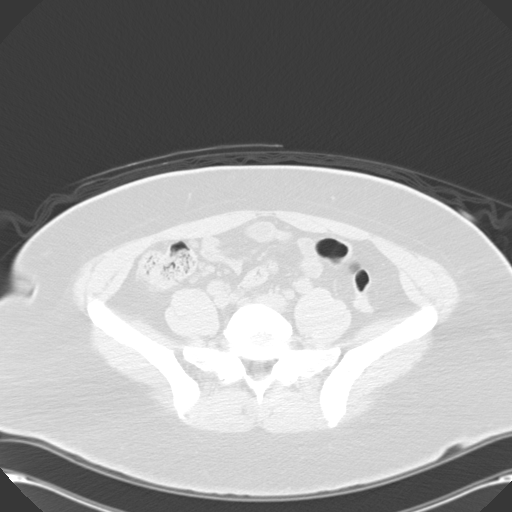
[im 45/52  soft-tissue]
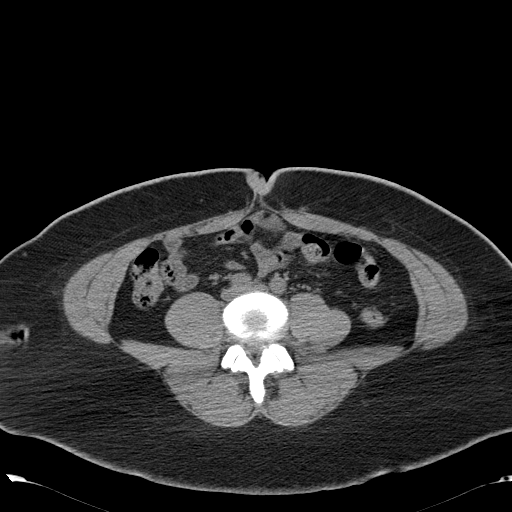
[im 45/52  lung]
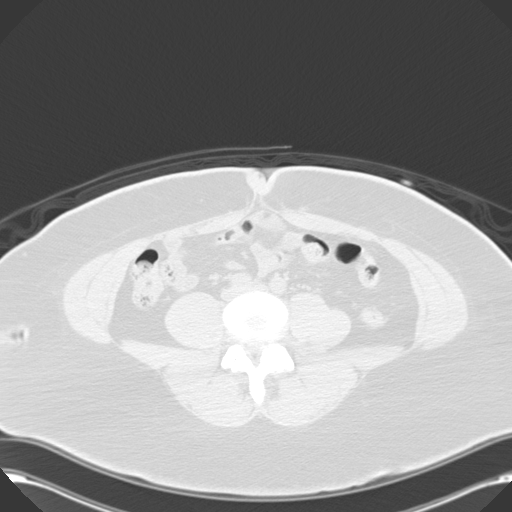

[Series 5: (hospital) 6.0 b30f · axial · 0.74mm/px · z∈[-155,-148]mm · 8 of 112 slices shown]
[im 11/112  soft-tissue]
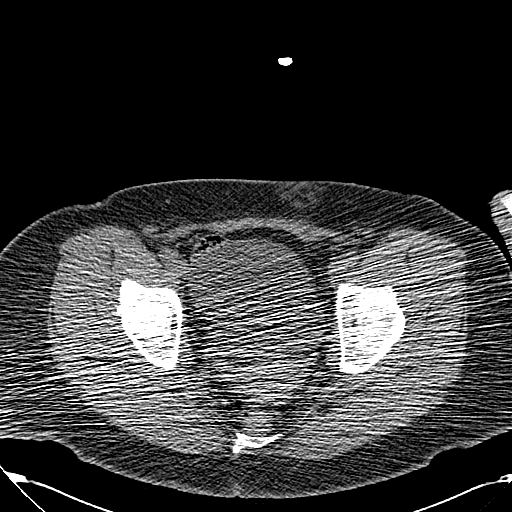
[im 22/112  soft-tissue]
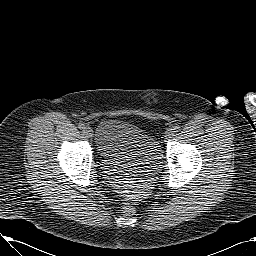
[im 38/112  soft-tissue]
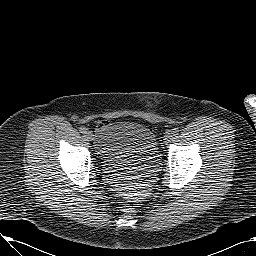
[im 48/112  soft-tissue]
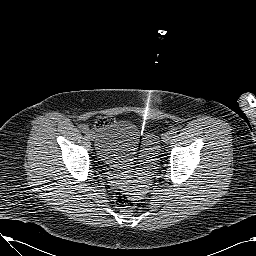
[im 64/112  soft-tissue]
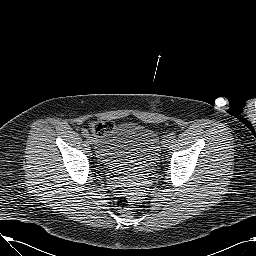
[im 75/112  soft-tissue]
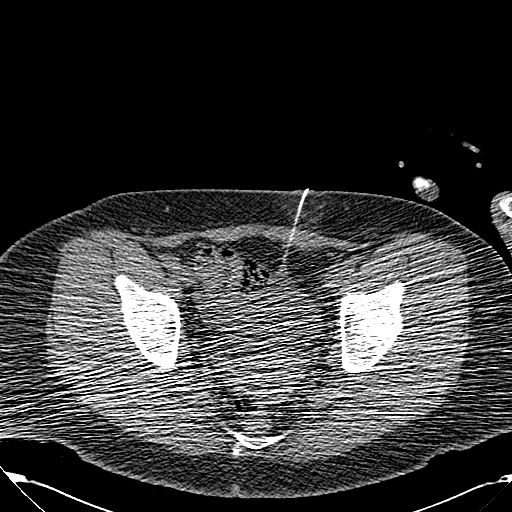
[im 90/112  soft-tissue]
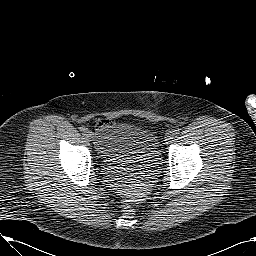
[im 101/112  soft-tissue]
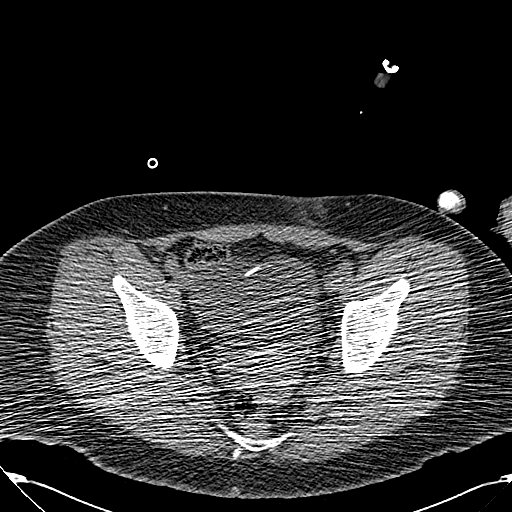

[15 of 32 positions shown; findings below may reference images not displayed]

Technique and findings,:  Survey scans through the lower pelvis
were obtained and the the cystic structure cephalad to the urinary
bladder was localized.  An appropriate skin entry site was
determined.   Site was marked, prepped with Betadine, draped in
usual sterile fashion, infiltrated locally with 1% lidocaine.
Intravenous Fentanyl and Versed were administered as conscious
sedation during continuous cardiorespiratory monitoring by the
radiology RN, with a total moderate sedation time of 15 minutes.
Under CT fluoroscopic guidance, a 10 cm 5-Jean Elie Fael Peeway multi side-
hole sheath needle was advanced to the lesion.  The needle tip
would pass into the lesion but the Francinilson Tidre would not advance
into the collection.  For this reason, a 19 gauge percutaneous
entry needle was advanced under CT fluoroscopy into the collection.
An Amplatz guide wire advanced into the collection, its position
confirmed on CT fluoroscopy.  Over this, a 15 cm 5-French multi
Bakhrul Kadali   Balderramo was advanced.  Approximately 200 ml of
clear pale yellow fluid were aspirated, sent for cytology analysis.
Final images showed decompression of the collection. The patient
tolerated the procedure well. No immediate complication
IMPRESSION: Technically successful CT-guided aspiration of cystic pelvic
lesion.

## 2011-02-25 ENCOUNTER — Other Ambulatory Visit: Payer: Self-pay | Admitting: Family Medicine

## 2011-02-28 ENCOUNTER — Other Ambulatory Visit: Payer: Self-pay | Admitting: Family Medicine

## 2011-03-24 ENCOUNTER — Ambulatory Visit (INDEPENDENT_AMBULATORY_CARE_PROVIDER_SITE_OTHER): Payer: No Typology Code available for payment source | Admitting: Family Medicine

## 2011-03-24 ENCOUNTER — Other Ambulatory Visit: Payer: Self-pay | Admitting: Family Medicine

## 2011-03-24 ENCOUNTER — Encounter: Payer: Self-pay | Admitting: Family Medicine

## 2011-03-24 DIAGNOSIS — I10 Essential (primary) hypertension: Secondary | ICD-10-CM

## 2011-03-24 DIAGNOSIS — Z0000 Encounter for general adult medical examination without abnormal findings: Secondary | ICD-10-CM | POA: Insufficient documentation

## 2011-03-24 LAB — CBC WITH DIFFERENTIAL/PLATELET
Basophils Absolute: 0 10*3/uL (ref 0.0–0.1)
Basophils Relative: 0.5 % (ref 0.0–3.0)
Eosinophils Absolute: 0.1 10*3/uL (ref 0.0–0.7)
Eosinophils Relative: 1 % (ref 0.0–5.0)
HCT: 42.5 % (ref 36.0–46.0)
Lymphocytes Relative: 30 % (ref 12.0–46.0)
MCV: 85.7 fl (ref 78.0–100.0)
Neutro Abs: 4.4 10*3/uL (ref 1.4–7.7)
RDW: 13.1 % (ref 11.5–14.6)
WBC: 7.1 10*3/uL (ref 4.5–10.5)

## 2011-03-24 LAB — HEPATIC FUNCTION PANEL
Alkaline Phosphatase: 78 U/L (ref 39–117)
Bilirubin, Direct: 0 mg/dL (ref 0.0–0.3)
Total Protein: 7.8 g/dL (ref 6.0–8.3)

## 2011-03-24 LAB — BASIC METABOLIC PANEL
CO2: 26 mEq/L (ref 19–32)
Calcium: 9.5 mg/dL (ref 8.4–10.5)
Chloride: 104 mEq/L (ref 96–112)
Creatinine, Ser: 0.8 mg/dL (ref 0.4–1.2)
GFR: 111.91 mL/min (ref >60.00)

## 2011-03-24 LAB — LIPID PANEL
HDL: 43.9 mg/dL (ref >39.00)
LDL Cholesterol: 120 mg/dL — ABNORMAL HIGH (ref 0–99)
Total CHOL/HDL Ratio: 4
VLDL: 27.4 mg/dL (ref 0.0–40.0)

## 2011-03-24 MED ORDER — BENAZEPRIL-HYDROCHLOROTHIAZIDE 20-12.5 MG PO TABS: 1.0000 | ORAL_TABLET | Freq: Every day | ORAL | Status: DC

## 2011-03-24 MED ORDER — AMLODIPINE BESYLATE 10 MG PO TABS: 10.0000 mg | ORAL_TABLET | Freq: Every day | ORAL | Status: DC

## 2011-03-24 NOTE — Patient Instructions (Signed)
Schedule your complete physical at your convenience- you can eat since we'll have the labs We'll notify you of your lab results You look great!  Keep up the good work! Call with any questions or concerns Happy Halloween!!!

## 2011-03-24 NOTE — Assessment & Plan Note (Signed)
Labs collected for upcoming CPE 

## 2011-03-24 NOTE — Progress Notes (Signed)
Addended by: Beverely Low on: 03/24/2011 03:16 PM   Modules accepted: Orders

## 2011-03-24 NOTE — Progress Notes (Signed)
  Subjective:    Patient ID: Frances Randolph, female    DOB: Jul 29, 1980, 30 y.o.   MRN: [id number]  HPI HTN- chronic problem for pt.  Reports last night BP was 111/60s.  Taking norvasc and Lotensin HCT.  No CP, SOB, HAs, visual changes, edema.   Review of Systems For ROS see HPI     Objective:   Physical Exam  Vitals reviewed. Constitutional: She is oriented to person, place, and time. She appears well-developed and well-nourished. No distress.  HENT:  Head: Normocephalic and atraumatic.  Eyes: Conjunctivae and EOM are normal. Pupils are equal, round, and reactive to light.  Neck: Normal range of motion. Neck supple. No thyromegaly present.  Cardiovascular: Normal rate, regular rhythm, normal heart sounds and intact distal pulses.   No murmur heard. Pulmonary/Chest: Effort normal and breath sounds normal. No respiratory distress.  Abdominal: Soft. She exhibits no distension. There is no tenderness.  Musculoskeletal: She exhibits no edema.  Lymphadenopathy:    She has no cervical adenopathy.  Neurological: She is alert and oriented to person, place, and time.  Skin: Skin is warm and dry.  Psychiatric: She has a normal mood and affect. Her behavior is normal.          Assessment & Plan:

## 2011-03-24 NOTE — Assessment & Plan Note (Signed)
Ongoing issue for pt.  Fair control in office, excellent control out of office.  Asymptomatic.  No med changes.

## 2011-03-24 NOTE — Progress Notes (Signed)
Quick Note:    Labs mailed.  ______

## 2011-03-27 LAB — VITAMIN D 1,25 DIHYDROXY: Vitamin D2 1, 25 (OH)2: <8 pg/mL

## 2011-06-13 ENCOUNTER — Encounter: Payer: No Typology Code available for payment source | Admitting: Family Medicine

## 2011-10-30 ENCOUNTER — Other Ambulatory Visit: Payer: Self-pay | Admitting: Family Medicine

## 2011-10-30 MED ORDER — BENAZEPRIL-HYDROCHLOROTHIAZIDE 20-12.5 MG PO TABS: 1.0000 | ORAL_TABLET | Freq: Every day | ORAL | Status: DC

## 2011-10-30 NOTE — Telephone Encounter (Signed)
rx sent to pharmacy by e-script for #30 Letter has been mailed to pt address noted in the chart to advise they are overdue for cpe/ov/labs and the pt needs to contact office to set up appt

## 2011-11-28 ENCOUNTER — Other Ambulatory Visit: Payer: Self-pay

## 2011-11-28 MED ORDER — BENAZEPRIL-HYDROCHLOROTHIAZIDE 20-12.5 MG PO TABS: 1.0000 | ORAL_TABLET | Freq: Every day | ORAL | Status: DC

## 2011-11-28 MED ORDER — AMLODIPINE BESYLATE 10 MG PO TABS: 10.0000 mg | ORAL_TABLET | Freq: Every day | ORAL | Status: DC

## 2011-11-28 NOTE — Telephone Encounter (Signed)
Call from patient and she requested  A refill on BP meds. Patient is due for a CPE and I scheduled it for the 1st avail according to the patient's schedule at the time which is august 16 at 10:30. Rx faxed to the pharmacy       KP

## 2011-12-15 ENCOUNTER — Other Ambulatory Visit: Payer: Self-pay | Admitting: *Deleted

## 2011-12-15 MED ORDER — BENAZEPRIL-HYDROCHLOROTHIAZIDE 20-12.5 MG PO TABS: 2.0000 | ORAL_TABLET | Freq: Every day | ORAL | Status: DC

## 2011-12-15 NOTE — Telephone Encounter (Signed)
[facility] Morrie Sheldon called to advise the pt is out of her benazapril-hctz per pt notes that she is taking 2 tablets daily but current directions note 1 time daily, unable to locate a change of dosage in last OV noted 03-24-11, MD Tabori reviewed pt chart and noted ok for pt to take 2 tablets daily per noted increase in tablets given on the OV 10-12 to #60, gave verbal order to give pt benazapril-hctz 20-12.5mg  take TWO tablets daily, #60 with 3 refills changed in chart as phone in, MD Tabori made aware verbally

## 2012-01-23 ENCOUNTER — Encounter: Payer: Self-pay | Admitting: Family Medicine

## 2012-01-23 ENCOUNTER — Ambulatory Visit (INDEPENDENT_AMBULATORY_CARE_PROVIDER_SITE_OTHER): Payer: No Typology Code available for payment source | Admitting: Family Medicine

## 2012-01-23 VITALS — BP 126/82 | HR 87 | Temp 98.5°F | Ht 67.0 in | Wt 199.6 lb

## 2012-01-23 DIAGNOSIS — B353 Tinea pedis: Secondary | ICD-10-CM

## 2012-01-23 DIAGNOSIS — Z0000 Encounter for general adult medical examination without abnormal findings: Secondary | ICD-10-CM

## 2012-01-23 DIAGNOSIS — I10 Essential (primary) hypertension: Secondary | ICD-10-CM

## 2012-01-23 LAB — HEPATIC FUNCTION PANEL
ALT: 20 U/L (ref 0–35)
Alkaline Phosphatase: 59 U/L (ref 39–117)
Bilirubin, Direct: 0 mg/dL (ref 0.0–0.3)
Total Bilirubin: 0.5 mg/dL (ref 0.3–1.2)

## 2012-01-23 LAB — CBC WITH DIFFERENTIAL/PLATELET
Basophils Absolute: 0 10*3/uL (ref 0.0–0.1)
Eosinophils Absolute: 0.1 10*3/uL (ref 0.0–0.7)
Lymphocytes Relative: 27.8 % (ref 12.0–46.0)
MCHC: 32 g/dL (ref 30.0–36.0)
Monocytes Relative: 7.7 % (ref 3.0–12.0)
Neutro Abs: 4 10*3/uL (ref 1.4–7.7)
Neutrophils Relative %: 63.1 % (ref 43.0–77.0)
Platelets: 316 10*3/uL (ref 150.0–400.0)
RDW: 13.6 % (ref 11.5–14.6)

## 2012-01-23 LAB — BASIC METABOLIC PANEL
CO2: 24 mEq/L (ref 19–32)
Chloride: 104 mEq/L (ref 96–112)
Creatinine, Ser: 0.8 mg/dL (ref 0.4–1.2)
Potassium: 3.6 mEq/L (ref 3.5–5.1)
Sodium: 138 mEq/L (ref 135–145)

## 2012-01-23 LAB — LIPID PANEL
LDL Cholesterol: 106 mg/dL — ABNORMAL HIGH (ref 0–99)
VLDL: 15.2 mg/dL (ref 0.0–40.0)

## 2012-01-23 MED ORDER — CLOTRIMAZOLE-BETAMETHASONE 1-0.05 % EX CREA: TOPICAL_CREAM | Freq: Two times a day (BID) | CUTANEOUS | Status: DC

## 2012-01-23 NOTE — Patient Instructions (Addendum)
Follow up in 6 months to recheck blood pressure We'll notify you of your lab results You look great!!!!  Keep up the good work!!! Apply the Lotrisone cream twice daily to your feet and ankles Call with any questions or concerns Enjoy the rest of your summer!!!

## 2012-01-23 NOTE — Progress Notes (Signed)
  Subjective:    Patient ID: Frances Randolph, female    DOB: [birth year], 31 y.o.   MRN: [id number]  HPI CPE- UTD w/ GYN, has lost 17 lbs since Oct  Skin lesions- occuring on both feet and ankles.  Noticed after pedicure.  Denies itching.  Circular and raised.  Worried about ringworm.  Has not tried OTC meds.   Review of Systems Patient reports no vision/ hearing changes, adenopathy,fever, weight change,  persistant/recurrent hoarseness , swallowing issues, chest pain, palpitations, edema, persistant/recurrent cough, hemoptysis, dyspnea (rest/exertional/paroxysmal nocturnal), gastrointestinal bleeding (melena, rectal bleeding), abdominal pain, significant heartburn, bowel changes, GU symptoms (dysuria, hematuria, incontinence), Gyn symptoms (abnormal  bleeding, pain),  syncope, focal weakness, memory loss, numbness & tingling, hair/nail changes, abnormal bruising or bleeding, anxiety, or depression.     Objective:   Physical Exam General Appearance:    Alert, cooperative, no distress, appears stated age  Head:    Normocephalic, without obvious abnormality, atraumatic  Eyes:    PERRL, conjunctiva/corneas clear, EOM's intact, fundi    benign, both eyes  Ears:    Normal TM's and external ear canals, both ears  Nose:   Nares normal, septum midline, mucosa normal, no drainage    or sinus tenderness  Throat:   Lips, mucosa, and tongue normal; teeth and gums normal  Neck:   Supple, symmetrical, trachea midline, no adenopathy;    Thyroid: no enlargement/tenderness/nodules  Back:     Symmetric, no curvature, ROM normal, no CVA tenderness  Lungs:     Clear to auscultation bilaterally, respirations unlabored  Chest Wall:    No tenderness or deformity   Heart:    Regular rate and rhythm, S1 and S2 normal, no murmur, rub   or gallop  Breast Exam:    Deferred to GYN  Abdomen:     Soft, non-tender, bowel sounds active all four quadrants,    no masses, no organomegaly  Genitalia:    Deferred to GYN    Rectal:    Extremities:   Extremities normal, atraumatic, no cyanosis or edema  Pulses:   2+ and symmetric all extremities  Skin:   Skin color, texture, turgor normal, scattered circular areas on dorsum of feet bilaterally and ankles consistent w/ ringworm  Lymph nodes:   Cervical, supraclavicular, and axillary nodes normal  Neurologic:   CNII-XII intact, normal strength, sensation and reflexes    throughout          Assessment & Plan:

## 2012-01-23 NOTE — Assessment & Plan Note (Signed)
Pt's PE WNL.  UTD on GYN screening.  Check labs.  Anticipatory guidance provided. 

## 2012-01-23 NOTE — Assessment & Plan Note (Signed)
BP well controlled.  Asymptomatic.  Applauded pt's weight loss.  Will continue to follow.

## 2012-01-23 NOTE — Assessment & Plan Note (Signed)
New.  Pt's PE consistent w/ ringworm.  Start combo steroid/antifungal to both tx infxn and decrease hyperpigmentation.  Reviewed supportive care and red flags that should prompt return.  Pt expressed understanding and is in agreement w/ plan.

## 2012-01-26 ENCOUNTER — Encounter: Payer: Self-pay | Admitting: *Deleted

## 2012-03-26 ENCOUNTER — Other Ambulatory Visit: Payer: Self-pay | Admitting: Family Medicine

## 2012-03-26 MED ORDER — AMLODIPINE BESYLATE 10 MG PO TABS: 10.0000 mg | ORAL_TABLET | Freq: Every day | ORAL | Status: DC

## 2012-03-26 NOTE — Telephone Encounter (Signed)
refill Amlodipine Besylate 10mg  tab #30 wt/2-refills take 1 tablet bt mouth daily last fill 9.22.13 last ov 8.16.13 V70

## 2012-03-26 NOTE — Telephone Encounter (Signed)
refill Amlodipine Besylate 10mg  tab #30 wt/2-refills take 1 tablet bt mouth daily last fill 9.22.13 last ov 8.16.13 V70     Plz advise   MW

## 2012-07-05 ENCOUNTER — Other Ambulatory Visit: Payer: Self-pay | Admitting: *Deleted

## 2012-07-05 DIAGNOSIS — I10 Essential (primary) hypertension: Secondary | ICD-10-CM

## 2012-07-05 MED ORDER — BENAZEPRIL-HYDROCHLOROTHIAZIDE 20-12.5 MG PO TABS: 2.0000 | ORAL_TABLET | Freq: Every day | ORAL | Status: DC

## 2012-07-05 NOTE — Telephone Encounter (Signed)
[facility] per pt request

## 2012-07-13 ENCOUNTER — Other Ambulatory Visit: Payer: Self-pay | Admitting: *Deleted

## 2012-07-13 DIAGNOSIS — I10 Essential (primary) hypertension: Secondary | ICD-10-CM

## 2012-07-13 MED ORDER — AMLODIPINE BESYLATE 10 MG PO TABS: 10.0000 mg | ORAL_TABLET | Freq: Every day | ORAL | Status: DC

## 2012-07-13 NOTE — Telephone Encounter (Signed)
Patient came to office stating pharmacy has problem with directions for med refill. [facility] spoke with pharmacist gave verbal order to refill Lotensin and Norvasc.

## 2012-07-30 ENCOUNTER — Ambulatory Visit: Payer: No Typology Code available for payment source | Admitting: Family Medicine

## 2012-07-30 DIAGNOSIS — Z0289 Encounter for other administrative examinations: Secondary | ICD-10-CM

## 2012-11-19 ENCOUNTER — Other Ambulatory Visit: Payer: Self-pay | Admitting: Family Medicine

## 2012-12-30 ENCOUNTER — Other Ambulatory Visit: Payer: Self-pay | Admitting: Family Medicine

## 2013-02-18 ENCOUNTER — Other Ambulatory Visit: Payer: Self-pay | Admitting: Family Medicine

## 2013-02-22 NOTE — Telephone Encounter (Signed)
Pt made aware of an office visit needs to be schedule. Rx filled #14, 0 refiils . SW, CMA

## 2013-03-18 ENCOUNTER — Ambulatory Visit (INDEPENDENT_AMBULATORY_CARE_PROVIDER_SITE_OTHER): Payer: No Typology Code available for payment source | Admitting: Family Medicine

## 2013-03-18 ENCOUNTER — Encounter: Payer: Self-pay | Admitting: Family Medicine

## 2013-03-18 VITALS — BP 130/86 | HR 70 | Temp 98.3°F | Resp 16 | Wt 208.0 lb

## 2013-03-18 DIAGNOSIS — E010 Iodine-deficiency related diffuse (endemic) goiter: Secondary | ICD-10-CM | POA: Insufficient documentation

## 2013-03-18 DIAGNOSIS — E049 Nontoxic goiter, unspecified: Secondary | ICD-10-CM

## 2013-03-18 DIAGNOSIS — B353 Tinea pedis: Secondary | ICD-10-CM

## 2013-03-18 DIAGNOSIS — I10 Essential (primary) hypertension: Secondary | ICD-10-CM

## 2013-03-18 LAB — BASIC METABOLIC PANEL
BUN: 13 mg/dL (ref 6–23)
Calcium: 9.4 mg/dL (ref 8.4–10.5)
Potassium: 3.8 mEq/L (ref 3.5–5.3)
Sodium: 136 mEq/L (ref 135–145)

## 2013-03-18 MED ORDER — AMLODIPINE BESYLATE 10 MG PO TABS: 10.0000 mg | ORAL_TABLET | Freq: Every day | ORAL | Status: DC

## 2013-03-18 MED ORDER — BENAZEPRIL-HYDROCHLOROTHIAZIDE 20-12.5 MG PO TABS: ORAL_TABLET | ORAL | Status: DC

## 2013-03-18 MED ORDER — CLOTRIMAZOLE-BETAMETHASONE 1-0.05 % EX CREA: TOPICAL_CREAM | Freq: Two times a day (BID) | CUTANEOUS | Status: DC

## 2013-03-18 NOTE — Patient Instructions (Signed)
Schedule your complete physical in 6 months We'll notify you of your lab results and make any changes if needed We'll notify you of your ultrasound results Call with any questions or concerns Happy Fall!!

## 2013-03-18 NOTE — Progress Notes (Signed)
  Subjective:    Patient ID: Frances Randolph, female    DOB: 11-16-1980, 32 y.o.   MRN: [id number]  HPI HTN- chronic problem, on Norvasc, Benazepril-HCTZ.  No CP, SOB, HAs, visual changes, edema.  Initially had some swelling after starting the Norvasc if she was wearing heels.   Review of Systems For ROS see HPI     Objective:   Physical Exam  Vitals reviewed. Constitutional: She is oriented to person, place, and time. She appears well-developed and well-nourished. No distress.  HENT:  Head: Normocephalic and atraumatic.  Eyes: Conjunctivae and EOM are normal. Pupils are equal, round, and reactive to light.  Neck: Normal range of motion. Neck supple. Thyromegaly (diffuse w/out nodularity) present.  Cardiovascular: Normal rate, regular rhythm, normal heart sounds and intact distal pulses.   No murmur heard. Pulmonary/Chest: Effort normal and breath sounds normal. No respiratory distress.  Abdominal: Soft. She exhibits no distension. There is no tenderness.  Musculoskeletal: She exhibits no edema.  Lymphadenopathy:    She has no cervical adenopathy.  Neurological: She is alert and oriented to person, place, and time.  Skin: Skin is warm and dry.  Psychiatric: She has a normal mood and affect. Her behavior is normal.          Assessment & Plan:

## 2013-03-19 LAB — TSH: TSH: 1.285 u[IU]/mL (ref 0.350–4.500)

## 2013-03-20 NOTE — Assessment & Plan Note (Signed)
New.  Check labs.  Get US to assess. 

## 2013-03-20 NOTE — Assessment & Plan Note (Signed)
Refill ointment.

## 2013-03-20 NOTE — Assessment & Plan Note (Signed)
Chronic problem, adequate control.  Asymptomatic.  Check labs.  No anticipated changes. 

## 2013-03-21 ENCOUNTER — Encounter: Payer: Self-pay | Admitting: General Practice

## 2013-03-25 ENCOUNTER — Ambulatory Visit (HOSPITAL_BASED_OUTPATIENT_CLINIC_OR_DEPARTMENT_OTHER)
Admission: RE | Admit: 2013-03-25 | Discharge: 2013-03-25 | Disposition: A | Discharge status: Home / Self Care | Payer: No Typology Code available for payment source | Source: Ambulatory Visit | Attending: Family Medicine | Admitting: Family Medicine

## 2013-03-25 DIAGNOSIS — E010 Iodine-deficiency related diffuse (endemic) goiter: Secondary | ICD-10-CM

## 2013-03-25 DIAGNOSIS — E049 Nontoxic goiter, unspecified: Secondary | ICD-10-CM | POA: Insufficient documentation

## 2013-12-28 ENCOUNTER — Other Ambulatory Visit: Payer: Self-pay | Admitting: Family Medicine

## 2013-12-28 NOTE — Telephone Encounter (Signed)
Med filled and letter mailed to pt to make a CPE appt.

## 2014-02-21 ENCOUNTER — Ambulatory Visit (INDEPENDENT_AMBULATORY_CARE_PROVIDER_SITE_OTHER): Payer: BC Managed Care – PPO | Admitting: Family Medicine

## 2014-02-21 ENCOUNTER — Encounter: Payer: Self-pay | Admitting: Family Medicine

## 2014-02-21 VITALS — BP 104/78 | HR 88 | Temp 98.0°F | Resp 16 | Wt 201.5 lb

## 2014-02-21 DIAGNOSIS — N899 Noninflammatory disorder of vagina, unspecified: Secondary | ICD-10-CM

## 2014-02-21 DIAGNOSIS — I10 Essential (primary) hypertension: Secondary | ICD-10-CM

## 2014-02-21 DIAGNOSIS — N898 Other specified noninflammatory disorders of vagina: Secondary | ICD-10-CM | POA: Insufficient documentation

## 2014-02-21 LAB — BASIC METABOLIC PANEL
BUN: 7 mg/dL (ref 6–23)
CALCIUM: 9.6 mg/dL (ref 8.4–10.5)
CO2: 25 mEq/L (ref 19–32)
Chloride: 101 mEq/L (ref 96–112)
Creatinine, Ser: 0.8 mg/dL (ref 0.4–1.2)
GFR: 102.23 mL/min (ref >60.00)
Glucose, Bld: 87 mg/dL (ref 70–99)
Potassium: 4 mEq/L (ref 3.5–5.1)
SODIUM: 136 meq/L (ref 135–145)

## 2014-02-21 MED ORDER — FLUCONAZOLE 150 MG PO TABS: 150.0000 mg | ORAL_TABLET | Freq: Once | ORAL | Status: DC

## 2014-02-21 MED ORDER — NYSTATIN 100000 UNIT/GM EX CREA: 1.0000 "application " | TOPICAL_CREAM | Freq: Two times a day (BID) | CUTANEOUS | Status: DC

## 2014-02-21 NOTE — Progress Notes (Signed)
   Subjective:    Patient ID: Frances Randolph, female    DOB: 1980-08-02, 33 y.o.   MRN: [id number]  HPI Vaginal irritation- pt reports changing laundry detergent and body wash and now complains of irritation, burning, 'it feels hot'.  Is celibate so no concerns for STDs.  Started Menses today.  Has hx of similar when changing detergents.  sxs started 2 weeks ago.  HTN- chronic problem, pt actually reports BP has been dropping low.  Last night was 93/58.  Denies dizzines.  No CP, SOB, HAs, visual changes, edema.  Pt continues to lose weight and is exercising regularly.  Review of Systems For ROS see HPI     Objective:   Physical Exam  Vitals reviewed. Constitutional: She is oriented to person, place, and time. She appears well-developed and well-nourished. No distress.  HENT:  Head: Normocephalic and atraumatic.  Eyes: Conjunctivae and EOM are normal. Pupils are equal, round, and reactive to light.  Neck: Normal range of motion. Neck supple. No thyromegaly present.  Cardiovascular: Normal rate, regular rhythm, normal heart sounds and intact distal pulses.   No murmur heard. Pulmonary/Chest: Effort normal and breath sounds normal. No respiratory distress.  Abdominal: Soft. She exhibits no distension. There is no tenderness.  Genitourinary:  Pt declined exam due to heavy menses and fact she is wearing a pad  Musculoskeletal: She exhibits no edema.  Lymphadenopathy:    She has no cervical adenopathy.  Neurological: She is alert and oriented to person, place, and time.  Skin: Skin is warm and dry.  Psychiatric: She has a normal mood and affect. Her behavior is normal.          Assessment & Plan:

## 2014-02-21 NOTE — Patient Instructions (Signed)
Follow up in 4-6 weeks to recheck BP Decrease to 1 Benazepril/HCTZ daily and continue 1 amlodipine daily Take the Diflucan x1 dose Use the Nystatin cream twice daily to improve irritation If no improvement in the next week, please call! Call with any questions or concerns Hang in there!

## 2014-02-21 NOTE — Assessment & Plan Note (Signed)
Excellent control.  Pt is having episodes of low BP.  Will decrease Benazepril/HCTZ to 1 tab daily.  Continue amlodipine.  Check BMP.  Follow BP closely to monitor for increase.

## 2014-02-21 NOTE — Progress Notes (Signed)
Pre visit review using our clinic review tool, if applicable. No additional management support is needed unless otherwise documented below in the visit note. 

## 2014-02-21 NOTE — Assessment & Plan Note (Signed)
New.  Based on pt's sxs, she is having rxn to change in soaps/detergents.  Suspect external yeast.  Start topical tx and diflucan x1 dose.  If no improvement, pt will return for exam.  Pt expressed understanding and is in agreement w/ plan.

## 2014-02-22 ENCOUNTER — Encounter: Payer: Self-pay | Admitting: General Practice

## 2014-03-09 ENCOUNTER — Telehealth: Payer: Self-pay | Admitting: Family Medicine

## 2014-03-09 NOTE — Telephone Encounter (Signed)
If appts available for tomorrow, needs to see provider (she probably prefers female) as last time we were unable to do exam due to heavy menstrual bleeding

## 2014-03-09 NOTE — Telephone Encounter (Signed)
Pt states she was seen 03/01/14, for burning sensation vaginal area, states the medicine is not working and she is still very uncomfortable, pt wanted to be seen on tomorrow or would like to know if dr. Beverely Lowtabori can send in another rx for her to try.

## 2014-03-09 NOTE — Telephone Encounter (Signed)
Appointment scheduled with Frances CrazeMelissa Randolph tomorrow (03/10/14) at 8:45 am.

## 2014-03-10 ENCOUNTER — Encounter: Payer: Self-pay | Admitting: Family

## 2014-03-10 ENCOUNTER — Ambulatory Visit (INDEPENDENT_AMBULATORY_CARE_PROVIDER_SITE_OTHER): Payer: BC Managed Care – PPO | Admitting: Family

## 2014-03-10 VITALS — BP 134/79 | HR 64 | Temp 98.2°F | Resp 16 | Ht 67.0 in | Wt 203.4 lb

## 2014-03-10 DIAGNOSIS — N760 Acute vaginitis: Secondary | ICD-10-CM

## 2014-03-10 DIAGNOSIS — N898 Other specified noninflammatory disorders of vagina: Secondary | ICD-10-CM

## 2014-03-10 LAB — POCT URINALYSIS DIPSTICK
Bilirubin, UA: NEGATIVE
Blood, UA: NEGATIVE
Glucose, UA: NEGATIVE
Ketones, UA: NEGATIVE
NITRITE UA: NEGATIVE
Spec Grav, UA: 1.02
UROBILINOGEN UA: 0.2
pH, UA: 6.5

## 2014-03-10 MED ORDER — CIPROFLOXACIN HCL 500 MG PO TABS: 500.0000 mg | ORAL_TABLET | Freq: Two times a day (BID) | ORAL | Status: DC

## 2014-03-10 NOTE — Assessment & Plan Note (Signed)
Wet prep performed today. Urine notes trace leuks, will plan rx for possible UTI with cipro x 3 days. Send urine for culture.  Await wet prep results.

## 2014-03-10 NOTE — Patient Instructions (Signed)
We will contact you with your results.  You may apply vagisil as needed for external burning.

## 2014-03-10 NOTE — Progress Notes (Signed)
   Subjective:    Patient ID: Frances Randolph, female    DOB: 04/03/1981, 33 y.o.   MRN: [id number]  HPI  Vaginitis-  Reports burning.  Denies discharge, odor or fever. No improvement with diflucan.  Reports that she is not sexually active and has never been sexually active in the past.     Review of Systems See HPI  Past Medical History  Diagnosis Date  . Hypertension   . Anemia     History   Social History  . Marital Status: Single    Spouse Name: N/A    Number of Children: N/A  . Years of Education: N/A   Occupational History  . Not on file.   Social History Main Topics  . Smoking status: Never Smoker   . Smokeless tobacco: Not on file  . Alcohol Use: No  . Drug Use: No  . Sexual Activity:    Other Topics Concern  . Not on file   Social History Narrative   Lives with sister.    Past Surgical History  Procedure Laterality Date  . Brain surgery      Family History  Problem Relation Age of Onset  . Hypertension Father   . Cancer Mother     type unknown    No Known Allergies  Current Outpatient Prescriptions on File Prior to Visit  Medication Sig Dispense Refill  . amLODipine (NORVASC) 10 MG tablet TAKE 1 TABLET BY MOUTH EVERY DAY  30 tablet  6  . benazepril-hydrochlorthiazide (LOTENSIN HCT) 20-12.5 MG per tablet TAKE 2 TABLETS BY MOUTH EVERY DAY  60 tablet  6  . fluconazole (DIFLUCAN) 150 MG tablet Take 1 tablet (150 mg total) by mouth once.  1 tablet  0  . nystatin cream (MYCOSTATIN) Apply 1 application topically 2 (two) times daily.  30 g  1   No current facility-administered medications on file prior to visit.    BP 134/79  Pulse 64  Temp(Src) 98.2 F (36.8 C) (Oral)  Resp 16  Ht 5\' 7"  (1.702 m)  Wt 203 lb 6.4 oz (92.262 kg)  BMI 31.85 kg/m2  SpO2 100%  LMP 02/21/2014       Objective:   Physical Exam  Constitutional: She appears well-developed and well-nourished. No distress.  Cardiovascular: Normal rate and regular rhythm.   No  murmur heard. Pulmonary/Chest: Effort normal and breath sounds normal. No respiratory distress. She has no wheezes. She has no rales. She exhibits no tenderness.  Genitourinary: Vagina normal. No erythema around the vagina. No vaginal discharge found.          Assessment & Plan:

## 2014-03-10 NOTE — Progress Notes (Signed)
Pre visit review using our clinic review tool, if applicable. No additional management support is needed unless otherwise documented below in the visit note. 

## 2014-03-11 LAB — WET PREP BY MOLECULAR PROBE
Candida species: NEGATIVE
Gardnerella vaginalis: NEGATIVE
Trichomonas vaginosis: NEGATIVE

## 2014-03-11 LAB — URINE CULTURE
COLONY COUNT: NO GROWTH
Organism ID, Bacteria: NO GROWTH

## 2014-03-15 ENCOUNTER — Telehealth: Payer: Self-pay | Admitting: *Deleted

## 2014-03-15 NOTE — Telephone Encounter (Signed)
Left message on cell# to return my call. 

## 2014-03-15 NOTE — Telephone Encounter (Signed)
Notified pt. 

## 2014-03-15 NOTE — Telephone Encounter (Signed)
I would recommend vagisil otc for irritation.  If not improvement after she changes soap/detergents etc to unscented, let me know and I will place a GYN referral.

## 2014-03-15 NOTE — Telephone Encounter (Signed)
Message copied by Kathi SimpersFERGERSON, Shawanna Zanders A on Wed Mar 15, 2014  4:19 PM ------      Message from: AinaloaO'SULLIVAN, MELISSA      Created: Sun Mar 12, 2014  9:18 AM       Urine culture is negative.  She can stop cipro. Wet prep is negative for bacteria and negative for yeast.  I think her irritation is most likely due to something besides infection such as irritation from soap or detergent.  Make sure she is using unscented soaps and detergents. ------

## 2014-03-15 NOTE — Telephone Encounter (Signed)
Notified pt and she voices understanding. States she will try unscented soaps and lotions but wants to know what she can apply to help calm the current irritation and discomfort. States Dr Beverely Lowabori prescribed her some cream in the past that she can't remember the name. States that if she used the medication every day that symptoms were improved but if she missed one day irritation/discomfort was back.  Please advise.

## 2014-03-31 ENCOUNTER — Encounter: Payer: Self-pay | Admitting: Family Medicine

## 2014-03-31 ENCOUNTER — Ambulatory Visit (INDEPENDENT_AMBULATORY_CARE_PROVIDER_SITE_OTHER): Payer: BC Managed Care – PPO | Admitting: Family Medicine

## 2014-03-31 VITALS — BP 130/80 | HR 87 | Temp 98.2°F | Resp 16 | Wt 205.4 lb

## 2014-03-31 DIAGNOSIS — H6980 Other specified disorders of Eustachian tube, unspecified ear: Secondary | ICD-10-CM | POA: Insufficient documentation

## 2014-03-31 DIAGNOSIS — I10 Essential (primary) hypertension: Secondary | ICD-10-CM

## 2014-03-31 DIAGNOSIS — H6981 Other specified disorders of Eustachian tube, right ear: Secondary | ICD-10-CM

## 2014-03-31 MED ORDER — BENAZEPRIL-HYDROCHLOROTHIAZIDE 20-12.5 MG PO TABS: 1.0000 | ORAL_TABLET | Freq: Every day | ORAL | Status: DC

## 2014-03-31 NOTE — Patient Instructions (Signed)
Schedule your complete physical w/ pap at your convenience Continue 1 benazepril and 1 amlodipine daily for the BP Add Claritin or Zyrtec daily for allergy congestion Drink plenty of fluids Make healthy food choices and get regular exercise Call with any questions or concerns Happy Fall!

## 2014-03-31 NOTE — Assessment & Plan Note (Signed)
New.  No evidence of infection.  Start OTC antihistamine to improve sxs.  Reviewed supportive care and red flags that should prompt return.  Pt expressed understanding and is in agreement w/ plan.

## 2014-03-31 NOTE — Progress Notes (Signed)
   Subjective:    Patient ID: Frances Randolph, female    DOB: 10/21/1980, 33 y.o.   MRN: [id number]  HPI HTN- pt's BP was actually low last visit and we decreased to 1 Benazepril-HCTZ daily and 1 amlodipine.  'when not in doctor's offices BP is 110s/70s'.  Denies recent dizziness, fatigue.  Pt reports feeling much better.  No CP, SOB, HAs, visual changes, edema.  R ear pain- denies fever, drainage, + nasal congestion.   Review of Systems For ROS see HPI     Objective:   Physical Exam  Vitals reviewed. Constitutional: She appears well-developed and well-nourished. No distress.  HENT:  Head: Normocephalic and atraumatic.  Right Ear: Tympanic membrane normal.  Left Ear: Tympanic membrane normal.  Nose: Mucosal edema and rhinorrhea present. Right sinus exhibits no maxillary sinus tenderness and no frontal sinus tenderness. Left sinus exhibits no maxillary sinus tenderness and no frontal sinus tenderness.  Mouth/Throat: Mucous membranes are normal. Posterior oropharyngeal erythema (w/ PND) present.  Eyes: Conjunctivae and EOM are normal. Pupils are equal, round, and reactive to light.  Neck: Normal range of motion. Neck supple.  Cardiovascular: Normal rate, regular rhythm and normal heart sounds.   Pulmonary/Chest: Effort normal and breath sounds normal. No respiratory distress. She has no wheezes. She has no rales.  Lymphadenopathy:    She has no cervical adenopathy.          Assessment & Plan:

## 2014-03-31 NOTE — Assessment & Plan Note (Signed)
Remains well controlled despite decreasing Benazepril dose.  Pt remains asymptomatic.  No med changes at this time.  Will follow.

## 2014-03-31 NOTE — Progress Notes (Signed)
Pre visit review using our clinic review tool, if applicable. No additional management support is needed unless otherwise documented below in the visit note. 

## 2014-08-11 ENCOUNTER — Encounter: Payer: BC Managed Care – PPO | Admitting: Family Medicine

## 2014-10-11 ENCOUNTER — Other Ambulatory Visit: Payer: Self-pay | Admitting: General Practice

## 2014-10-11 MED ORDER — AMLODIPINE BESYLATE 10 MG PO TABS: 10.0000 mg | ORAL_TABLET | Freq: Every day | ORAL | Status: DC

## 2015-01-17 ENCOUNTER — Other Ambulatory Visit: Payer: Self-pay | Admitting: Family Medicine

## 2015-01-17 NOTE — Telephone Encounter (Signed)
Medication filled to pharmacy as requested. Pt is in need of Blood pressure follow up.

## 2015-02-12 ENCOUNTER — Other Ambulatory Visit: Payer: Self-pay | Admitting: Family Medicine

## 2015-02-13 NOTE — Telephone Encounter (Signed)
Med denied, pt needs an Blood pressure appointment.

## 2015-04-10 ENCOUNTER — Encounter: Payer: Self-pay | Admitting: Family Medicine

## 2015-04-10 ENCOUNTER — Other Ambulatory Visit: Payer: Self-pay | Admitting: Family Medicine

## 2015-04-10 ENCOUNTER — Ambulatory Visit (HOSPITAL_BASED_OUTPATIENT_CLINIC_OR_DEPARTMENT_OTHER)
Admission: RE | Admit: 2015-04-10 | Discharge: 2015-04-10 | Disposition: A | Discharge status: Home / Self Care | Payer: BLUE CROSS/BLUE SHIELD | Source: Ambulatory Visit | Attending: Family Medicine | Admitting: Family Medicine

## 2015-04-10 ENCOUNTER — Ambulatory Visit (INDEPENDENT_AMBULATORY_CARE_PROVIDER_SITE_OTHER): Payer: BLUE CROSS/BLUE SHIELD | Admitting: Family Medicine

## 2015-04-10 VITALS — BP 130/80 | HR 74 | Temp 98.2°F | Resp 16 | Ht 67.0 in | Wt 213.1 lb

## 2015-04-10 DIAGNOSIS — I10 Essential (primary) hypertension: Secondary | ICD-10-CM

## 2015-04-10 DIAGNOSIS — E669 Obesity, unspecified: Secondary | ICD-10-CM | POA: Diagnosis not present

## 2015-04-10 DIAGNOSIS — E049 Nontoxic goiter, unspecified: Secondary | ICD-10-CM | POA: Diagnosis not present

## 2015-04-10 DIAGNOSIS — E010 Iodine-deficiency related diffuse (endemic) goiter: Secondary | ICD-10-CM

## 2015-04-10 DIAGNOSIS — E559 Vitamin D deficiency, unspecified: Secondary | ICD-10-CM | POA: Insufficient documentation

## 2015-04-10 DIAGNOSIS — E042 Nontoxic multinodular goiter: Secondary | ICD-10-CM | POA: Diagnosis present

## 2015-04-10 LAB — CBC WITH DIFFERENTIAL/PLATELET
BASOS PCT: 0.4 % (ref 0.0–3.0)
Basophils Absolute: 0 10*3/uL (ref 0.0–0.1)
EOS ABS: 0.1 10*3/uL (ref 0.0–0.7)
EOS PCT: 1.3 % (ref 0.0–5.0)
HCT: 40.1 % (ref 36.0–46.0)
HEMOGLOBIN: 12.9 g/dL (ref 12.0–15.0)
Lymphocytes Relative: 35.3 % (ref 12.0–46.0)
Lymphs Abs: 1.9 10*3/uL (ref 0.7–4.0)
MCHC: 32.2 g/dL (ref 30.0–36.0)
MCV: 86.1 fl (ref 78.0–100.0)
MONO ABS: 0.5 10*3/uL (ref 0.1–1.0)
Monocytes Relative: 9.5 % (ref 3.0–12.0)
NEUTROS ABS: 2.9 10*3/uL (ref 1.4–7.7)
Neutrophils Relative %: 53.5 % (ref 43.0–77.0)
PLATELETS: 347 10*3/uL (ref 150.0–400.0)
RBC: 4.65 Mil/uL (ref 3.87–5.11)
RDW: 13.2 % (ref 11.5–15.5)
WBC: 5.4 10*3/uL (ref 4.0–10.5)

## 2015-04-10 LAB — BASIC METABOLIC PANEL
BUN: 5 mg/dL — ABNORMAL LOW (ref 6–23)
CO2: 29 meq/L (ref 19–32)
Calcium: 9.7 mg/dL (ref 8.4–10.5)
Chloride: 105 mEq/L (ref 96–112)
Creatinine, Ser: 0.82 mg/dL (ref 0.40–1.20)
GFR: 101.57 mL/min (ref >60.00)
Glucose, Bld: 89 mg/dL (ref 70–99)
POTASSIUM: 3.8 meq/L (ref 3.5–5.1)
SODIUM: 140 meq/L (ref 135–145)

## 2015-04-10 LAB — LIPID PANEL
CHOLESTEROL: 184 mg/dL (ref 0–200)
HDL: 47.2 mg/dL (ref >39.00)
LDL Cholesterol: 108 mg/dL — ABNORMAL HIGH (ref 0–99)
NonHDL: 136.5
Total CHOL/HDL Ratio: 4
Triglycerides: 144 mg/dL (ref 0.0–149.0)
VLDL: 28.8 mg/dL (ref 0.0–40.0)

## 2015-04-10 LAB — HEPATIC FUNCTION PANEL
ALT: 24 U/L (ref 0–35)
AST: 23 U/L (ref 0–37)
Albumin: 4.1 g/dL (ref 3.5–5.2)
Alkaline Phosphatase: 70 U/L (ref 39–117)
Bilirubin, Direct: 0 mg/dL (ref 0.0–0.3)
Total Bilirubin: 0.4 mg/dL (ref 0.2–1.2)
Total Protein: 7.4 g/dL (ref 6.0–8.3)

## 2015-04-10 LAB — TSH: TSH: 2.37 u[IU]/mL (ref 0.35–4.50)

## 2015-04-10 MED ORDER — VITAMIN D (ERGOCALCIFEROL) 1.25 MG (50000 UNIT) PO CAPS: 50000.0000 [IU] | ORAL_CAPSULE | ORAL | Status: DC

## 2015-04-10 NOTE — Progress Notes (Signed)
Pre visit review using our clinic review tool, if applicable. No additional management support is needed unless otherwise documented below in the visit note. 

## 2015-04-10 NOTE — Telephone Encounter (Signed)
Medication filled to pharmacy as requested.   

## 2015-04-10 NOTE — Assessment & Plan Note (Signed)
Deteriorated.  Pt has gained 10 lbs since last visit.  Admits to poor dietary habits and lack of exercise- stressed need for improvement on both.  Check labs to risk stratify.  Will follow.

## 2015-04-10 NOTE — Progress Notes (Signed)
   Subjective:    Patient ID: Frances Randolph, female    DOB: Jan 26, 1981, 34 y.o.   MRN: [id number]  HPI HTN- chronic problem, on Amlodipine, Benazepril HCTZ.  No CP, SOB, HAs, visual changes, edema.  Obesity- pt has gained 10 lbs since August.  Pt has not been exercising regularly, not following particular diet.  Vit D deficiency- pt had recent labs done and was told that level was 12 on 9/16.  Pt is not taking daily supplement.  Taking weekly dose on Saturday but having excessive fatigue by Thursday/Friday.  Thyromegaly- pt due for repeat imaging   Review of Systems For ROS see HPI     Objective:   Physical Exam  Constitutional: She is oriented to person, place, and time. She appears well-developed and well-nourished. No distress.  HENT:  Head: Normocephalic and atraumatic.  Eyes: Conjunctivae and EOM are normal. Pupils are equal, round, and reactive to light.  Neck: Normal range of motion. Neck supple. Thyromegaly present.  Cardiovascular: Normal rate, regular rhythm, normal heart sounds and intact distal pulses.   No murmur heard. Pulmonary/Chest: Effort normal and breath sounds normal. No respiratory distress.  Abdominal: Soft. She exhibits no distension. There is no tenderness.  Musculoskeletal: She exhibits no edema.  Lymphadenopathy:    She has no cervical adenopathy.  Neurological: She is alert and oriented to person, place, and time.  Skin: Skin is warm and dry.  Psychiatric: She has a normal mood and affect. Her behavior is normal.  Vitals reviewed.         Assessment & Plan:

## 2015-04-10 NOTE — Telephone Encounter (Signed)
Relation to ZO:XWRUpt:self Call back number:[phone] [id number] [place], [place] - [address]  Reason for call:  Patient is currently at the pharmacy patient requesting a refill Vitamin D, Ergocalciferol, (DRISDOL) 50000 UNITS CAPS capsule and benazepril-hydrochlorthiazide (LOTENSIN HCT) 20-12.5 MG per tablet. Patient is going out of town today.

## 2015-04-10 NOTE — Assessment & Plan Note (Signed)
Chronic problem.  Good control on current meds.  Asymptomatic.  Check labs.  No anticipated med changes.  Will follow.

## 2015-04-10 NOTE — Patient Instructions (Signed)
Schedule your complete physical in 6 months We'll notify you of your lab results and make any changes if needed Try and make healthy food choices and get regular exercise Add a daily OTC Vit D supplement of 5,000 units Continue the weekly Vit D for another 8 weeks Call with any questions or concerns If you want to join us at the new [place] office, any scheduled appointments will automatically transfer and we will see you at [address], [place], [place] [id number]  Enjoy your trip!!!

## 2015-04-10 NOTE — Assessment & Plan Note (Signed)
Pt had US done last year and is due for repeat imaging to assess for stability or change in nodules.

## 2015-04-10 NOTE — Assessment & Plan Note (Signed)
Noted on labs done at holistic medicine doctor.  Pt was started on 50,000 units weekly x8 weeks but notes increased fatigue by time dose is due.  Based on this, will continue 50,000 units for another 8 weeks and pt to add OTC daily supplement  Pt expressed understanding and is in agreement w/ plan.

## 2015-05-21 ENCOUNTER — Other Ambulatory Visit: Payer: Self-pay | Admitting: Family Medicine

## 2015-05-21 NOTE — Telephone Encounter (Signed)
Medication filled to pharmacy as requested.   

## 2015-06-19 ENCOUNTER — Other Ambulatory Visit: Payer: Self-pay | Admitting: Family Medicine

## 2015-07-13 ENCOUNTER — Telehealth: Payer: Self-pay | Admitting: Family Medicine

## 2015-07-13 NOTE — Telephone Encounter (Signed)
Patient Name: Frances Randolph DOB: [birth year] Initial Comment Caller states, elevated blood pressure 148/124 , feels sore in the back of her neck, she has been havng headache the last 2 days Nurse Assessment Nurse: Yetta Barre, RN, Miranda Date/Time (Eastern Time): 07/13/2015 10:30:36 AM Confirm and document reason for call. If symptomatic, describe symptoms. You must click the next button to save text entered. ---Caller states she has been "feeling off", headache and neck feeling tight. The last 3 days her BP has been higher than normal. Has the patient traveled out of the country within the last 30 days? ---Not Applicable Does the patient have any new or worsening symptoms? ---Yes Will a triage be completed? ---Yes Related visit to physician within the last 2 weeks? ---No Does the PT have any chronic conditions? (i.e. diabetes, asthma, etc.) ---Yes List chronic conditions. ---HTN Did the patient indicate they were pregnant? ---No Is this a behavioral health or substance abuse call? ---No Guidelines Guideline Title Affirmed Question Affirmed Notes High Blood Pressure BP # 180/110 Final Disposition User See Physician within 24 Hours Jones, RN, Miranda Referrals GO TO FACILITY UNDECIDED Disagree/Comply: Comply

## 2015-07-13 NOTE — Telephone Encounter (Signed)
Called to follow up with patient.  Left a message for call back.   

## 2015-07-13 NOTE — Telephone Encounter (Signed)
Patient called stating that her BP is elevated. Last checked at 10:00 and it was 148/124. States that her dystolic has been above 100 all morning. Transferred to [facility] spoke with Windy Fast

## 2015-07-13 NOTE — Telephone Encounter (Addendum)
Called to follow up with patient.  Pt states she went to an urgent care.  BP 1st check: 148/93; 2nd check: 134/82.  She was advised to go back to taking 2 benazepril-HCTZ and 1 amlodipine daily.  She was also advised to stop using wrist blood pressure cuff.  Pt states she plans to comply with plan.    Follow up appt scheduled with Dr. Beverely Low on Tuesday, Feb. 7th at 8:30 am.

## 2015-07-17 ENCOUNTER — Encounter: Payer: Self-pay | Admitting: Family Medicine

## 2015-07-17 ENCOUNTER — Ambulatory Visit (INDEPENDENT_AMBULATORY_CARE_PROVIDER_SITE_OTHER): Payer: BLUE CROSS/BLUE SHIELD | Admitting: Family Medicine

## 2015-07-17 VITALS — BP 134/80 | HR 113 | Temp 98.8°F | Ht 67.0 in | Wt 215.2 lb

## 2015-07-17 DIAGNOSIS — I10 Essential (primary) hypertension: Secondary | ICD-10-CM | POA: Diagnosis not present

## 2015-07-17 NOTE — Progress Notes (Signed)
Pre visit review using our clinic review tool, if applicable. No additional management support is needed unless otherwise documented below in the visit note. 

## 2015-07-17 NOTE — Progress Notes (Signed)
   Subjective:    Patient ID: Frances Randolph, female    DOB: [birth year], 35 y.o.   MRN: [id number]  HPI HTN- chronic problem, on Amlodipine, Benazepril-HCTZ daily w/ adequate control.  Pt was recently at [facility] and BP was 148/113.  Pt realized home cuff was not accurate.  No CP, SOB, HAs, visual changes, edema.  Exercising regularly- has lost 3 lbs since UC visit.   Review of Systems For ROS see HPI     Objective:   Physical Exam  Constitutional: She is oriented to person, place, and time. She appears well-developed and well-nourished. No distress.  HENT:  Head: Normocephalic and atraumatic.  Eyes: Conjunctivae and EOM are normal. Pupils are equal, round, and reactive to light.  Neck: Normal range of motion. Neck supple. Thyromegaly present.  Cardiovascular: Normal rate, regular rhythm, normal heart sounds and intact distal pulses.   No murmur heard. Pulmonary/Chest: Effort normal and breath sounds normal. No respiratory distress.  Abdominal: Soft. She exhibits no distension. There is no tenderness.  Musculoskeletal: She exhibits no edema.  Lymphadenopathy:    She has no cervical adenopathy.  Neurological: She is alert and oriented to person, place, and time.  Skin: Skin is warm and dry.  Psychiatric: She has a normal mood and affect. Her behavior is normal.  Vitals reviewed.         Assessment & Plan:

## 2015-07-17 NOTE — Assessment & Plan Note (Signed)
BP is adequately controlled today.  Asymptomatic.  No need to repeat labs.  Encouraged healthy diet, regular exercise, and low Na diet.  Will follow.  Pt expressed understanding and is in agreement w/ plan.

## 2015-07-17 NOTE — Patient Instructions (Signed)
Follow up as scheduled Continue your current BP meds- things look good! Continue to work on healthy diet and regular exercise- you can do it! Call with any questions or concerns Happy Valentine's Day!!!

## 2015-08-07 ENCOUNTER — Other Ambulatory Visit: Payer: Self-pay | Admitting: Family Medicine

## 2015-08-07 NOTE — Telephone Encounter (Signed)
Medication filled to pharmacy as requested.   

## 2015-09-19 ENCOUNTER — Ambulatory Visit: Payer: BLUE CROSS/BLUE SHIELD | Admitting: Internal Medicine

## 2015-09-19 ENCOUNTER — Telehealth: Payer: Self-pay | Admitting: Internal Medicine

## 2015-09-19 DIAGNOSIS — Z0289 Encounter for other administrative examinations: Secondary | ICD-10-CM

## 2015-09-20 NOTE — Telephone Encounter (Signed)
Pt was no show 09/19/15 4:00pm for acute appt, pt has not rescheduled, 1st no show w/in 12 months, charge or no charge?

## 2015-10-08 ENCOUNTER — Encounter: Payer: Self-pay | Admitting: Family Medicine

## 2015-10-08 ENCOUNTER — Ambulatory Visit (INDEPENDENT_AMBULATORY_CARE_PROVIDER_SITE_OTHER): Payer: BLUE CROSS/BLUE SHIELD | Admitting: Family Medicine

## 2015-10-08 ENCOUNTER — Other Ambulatory Visit: Payer: Self-pay | Admitting: General Practice

## 2015-10-08 ENCOUNTER — Other Ambulatory Visit (HOSPITAL_COMMUNITY)
Admission: RE | Admit: 2015-10-08 | Discharge: 2015-10-08 | Disposition: A | Discharge status: Home / Self Care | Payer: BLUE CROSS/BLUE SHIELD | Source: Ambulatory Visit | Attending: Family Medicine | Admitting: Family Medicine

## 2015-10-08 VITALS — BP 122/81 | HR 92 | Temp 98.2°F | Resp 16 | Ht 67.0 in | Wt 213.0 lb

## 2015-10-08 DIAGNOSIS — Z124 Encounter for screening for malignant neoplasm of cervix: Secondary | ICD-10-CM

## 2015-10-08 DIAGNOSIS — E785 Hyperlipidemia, unspecified: Secondary | ICD-10-CM

## 2015-10-08 DIAGNOSIS — Z0000 Encounter for general adult medical examination without abnormal findings: Secondary | ICD-10-CM | POA: Diagnosis not present

## 2015-10-08 DIAGNOSIS — Z1151 Encounter for screening for human papillomavirus (HPV): Secondary | ICD-10-CM | POA: Insufficient documentation

## 2015-10-08 DIAGNOSIS — Z01411 Encounter for gynecological examination (general) (routine) with abnormal findings: Secondary | ICD-10-CM | POA: Insufficient documentation

## 2015-10-08 LAB — CBC WITH DIFFERENTIAL/PLATELET
BASOS ABS: 0 10*3/uL (ref 0.0–0.1)
Basophils Relative: 0.4 % (ref 0.0–3.0)
EOS ABS: 0.1 10*3/uL (ref 0.0–0.7)
Eosinophils Relative: 1 % (ref 0.0–5.0)
HCT: 40.8 % (ref 36.0–46.0)
Hemoglobin: 13.3 g/dL (ref 12.0–15.0)
LYMPHS ABS: 2 10*3/uL (ref 0.7–4.0)
Lymphocytes Relative: 32 % (ref 12.0–46.0)
MCHC: 32.7 g/dL (ref 30.0–36.0)
MCV: 85.1 fl (ref 78.0–100.0)
MONO ABS: 0.5 10*3/uL (ref 0.1–1.0)
MONOS PCT: 8.5 % (ref 3.0–12.0)
NEUTROS ABS: 3.7 10*3/uL (ref 1.4–7.7)
NEUTROS PCT: 58.1 % (ref 43.0–77.0)
PLATELETS: 314 10*3/uL (ref 150.0–400.0)
RBC: 4.79 Mil/uL (ref 3.87–5.11)
RDW: 13.4 % (ref 11.5–15.5)
WBC: 6.4 10*3/uL (ref 4.0–10.5)

## 2015-10-08 LAB — TSH: TSH: 1.91 u[IU]/mL (ref 0.35–4.50)

## 2015-10-08 LAB — VITAMIN D 25 HYDROXY (VIT D DEFICIENCY, FRACTURES): VITD: 47.69 ng/mL (ref 30.00–100.00)

## 2015-10-08 LAB — HEPATIC FUNCTION PANEL
ALK PHOS: 65 U/L (ref 39–117)
ALT: 20 U/L (ref 0–35)
AST: 22 U/L (ref 0–37)
Albumin: 4.5 g/dL (ref 3.5–5.2)
BILIRUBIN DIRECT: 0.1 mg/dL (ref 0.0–0.3)
TOTAL PROTEIN: 8 g/dL (ref 6.0–8.3)
Total Bilirubin: 0.4 mg/dL (ref 0.2–1.2)

## 2015-10-08 LAB — LIPID PANEL
CHOLESTEROL: 232 mg/dL — AB (ref 0–200)
HDL: 46.4 mg/dL (ref >39.00)
LDL Cholesterol: 163 mg/dL — ABNORMAL HIGH (ref 0–99)
NonHDL: 185.99
TRIGLYCERIDES: 117 mg/dL (ref 0.0–149.0)
Total CHOL/HDL Ratio: 5
VLDL: 23.4 mg/dL (ref 0.0–40.0)

## 2015-10-08 LAB — BASIC METABOLIC PANEL
BUN: 8 mg/dL (ref 6–23)
CHLORIDE: 102 meq/L (ref 96–112)
CO2: 28 meq/L (ref 19–32)
Calcium: 10 mg/dL (ref 8.4–10.5)
Creatinine, Ser: 0.89 mg/dL (ref 0.40–1.20)
GFR: 92.15 mL/min (ref >60.00)
GLUCOSE: 94 mg/dL (ref 70–99)
POTASSIUM: 3.3 meq/L — AB (ref 3.5–5.1)
SODIUM: 137 meq/L (ref 135–145)

## 2015-10-08 MED ORDER — ATORVASTATIN CALCIUM 20 MG PO TABS: 20.0000 mg | ORAL_TABLET | Freq: Every evening | ORAL | Status: DC

## 2015-10-08 NOTE — Progress Notes (Signed)
Pre visit review using our clinic review tool, if applicable. No additional management support is needed unless otherwise documented below in the visit note. 

## 2015-10-08 NOTE — Progress Notes (Signed)
   Subjective:    Patient ID: Frances Randolph, female    DOB: [birth year], 35 y.o.   MRN: [id number]  HPI CPE- due for pap.  No concerns today.   Review of Systems Patient reports no vision/ hearing changes, adenopathy,fever, weight change,  persistant/recurrent hoarseness , swallowing issues, chest pain, palpitations, edema, persistant/recurrent cough, hemoptysis, dyspnea (rest/exertional/paroxysmal nocturnal), gastrointestinal bleeding (melena, rectal bleeding), abdominal pain, significant heartburn, bowel changes, GU symptoms (dysuria, hematuria, incontinence), Gyn symptoms (abnormal  bleeding, pain),  syncope, focal weakness, memory loss, numbness & tingling, skin/hair/nail changes, abnormal bruising or bleeding, anxiety, or depression.     Objective:   Physical Exam  General Appearance:    Alert, cooperative, no distress, appears stated age, obese  Head:    Normocephalic, without obvious abnormality, atraumatic  Eyes:    PERRL, conjunctiva/corneas clear, EOM's intact, fundi    benign, both eyes  Ears:    Normal TM's and external ear canals, both ears  Nose:   Nares normal, septum midline, mucosa normal, no drainage    or sinus tenderness  Throat:   Lips, mucosa, and tongue normal; teeth and gums normal  Neck:   Supple, symmetrical, trachea midline, no adenopathy;    Thyroid: Thyromegaly  Back:     Symmetric, no curvature, ROM normal, no CVA tenderness  Lungs:     Clear to auscultation bilaterally, respirations unlabored  Chest Wall:    No tenderness or deformity   Heart:    Regular rate and rhythm, S1 and S2 normal, no murmur, rub   or gallop  Breast Exam:    No tenderness, masses, or nipple abnormality  Abdomen:     Soft, non-tender, bowel sounds active all four quadrants,    no masses, no organomegaly  Genitalia:    External genitalia normal, cervix normal in appearance, no CMT, uterus in normal size and position, adnexa w/out mass or tenderness, mucosa pink and moist, no  lesions or discharge present  Rectal:    Normal external appearance  Extremities:   Extremities normal, atraumatic, no cyanosis or edema  Pulses:   2+ and symmetric all extremities  Skin:   Skin color, texture, turgor normal, no rashes or lesions  Lymph nodes:   Cervical, supraclavicular, and axillary nodes normal  Neurologic:   CNII-XII intact, normal strength, sensation and reflexes    throughout          Assessment & Plan:

## 2015-10-08 NOTE — Assessment & Plan Note (Signed)
Pap collected. 

## 2015-10-08 NOTE — Assessment & Plan Note (Signed)
Pt's PE WNL w/ exception of obesity.  Stressed need for healthy diet and regular exercise.  Check labs.  Anticipatory guidance provided.  

## 2015-10-08 NOTE — Patient Instructions (Signed)
Follow up in 6 months to recheck BP and weight loss progress We'll notify you of your lab results and make any changes if needed Continue to work on healthy diet and regular exercise- you can do it! Call with any questions or concerns Thanks for sticking with us! Happy Spring!!!

## 2015-10-09 LAB — CYTOLOGY - PAP

## 2015-10-11 ENCOUNTER — Encounter: Payer: Self-pay | Admitting: Family Medicine

## 2016-01-04 DIAGNOSIS — S60562A Insect bite (nonvenomous) of left hand, initial encounter: Secondary | ICD-10-CM | POA: Diagnosis not present

## 2016-01-14 DIAGNOSIS — S50362A Insect bite (nonvenomous) of left elbow, initial encounter: Secondary | ICD-10-CM | POA: Diagnosis not present

## 2016-01-14 DIAGNOSIS — S60561A Insect bite (nonvenomous) of right hand, initial encounter: Secondary | ICD-10-CM | POA: Diagnosis not present

## 2016-03-24 ENCOUNTER — Other Ambulatory Visit: Payer: Self-pay | Admitting: Family Medicine

## 2016-04-14 ENCOUNTER — Ambulatory Visit: Payer: Self-pay | Admitting: Family Medicine

## 2016-04-18 ENCOUNTER — Ambulatory Visit: Payer: Self-pay | Admitting: Family Medicine

## 2016-07-01 ENCOUNTER — Other Ambulatory Visit: Payer: Self-pay | Admitting: Family Medicine

## 2016-08-09 ENCOUNTER — Other Ambulatory Visit: Payer: Self-pay | Admitting: Family Medicine

## 2016-08-31 ENCOUNTER — Other Ambulatory Visit: Payer: Self-pay | Admitting: Family Medicine

## 2016-09-19 ENCOUNTER — Other Ambulatory Visit: Payer: Self-pay | Admitting: Family Medicine

## 2016-09-19 ENCOUNTER — Encounter: Payer: Self-pay | Admitting: General Practice

## 2016-09-19 NOTE — Telephone Encounter (Signed)
Med filled #60 and letter mailed to pt to schedule Bp appt.

## 2016-10-22 ENCOUNTER — Encounter: Payer: Self-pay | Admitting: General Practice

## 2016-10-22 ENCOUNTER — Other Ambulatory Visit: Payer: Self-pay | Admitting: Family Medicine

## 2016-12-08 ENCOUNTER — Other Ambulatory Visit: Payer: Self-pay | Admitting: Family Medicine

## 2017-07-10 IMAGING — US US SOFT TISSUE HEAD/NECK
1 series · 14 of 21 positions shown · non-contrast
Comparison: 03/25/2013

CLINICAL DATA: History of small right thyroid nodules.

EXAM:
THYROID ULTRASOUND
TECHNIQUE: Ultrasound examination of the thyroid gland and adjacent soft
tissues was performed.

[Series 1: us soft tissue head/neck · 0.05mm/px · 14 of 21 slices shown]
[im 1/21]
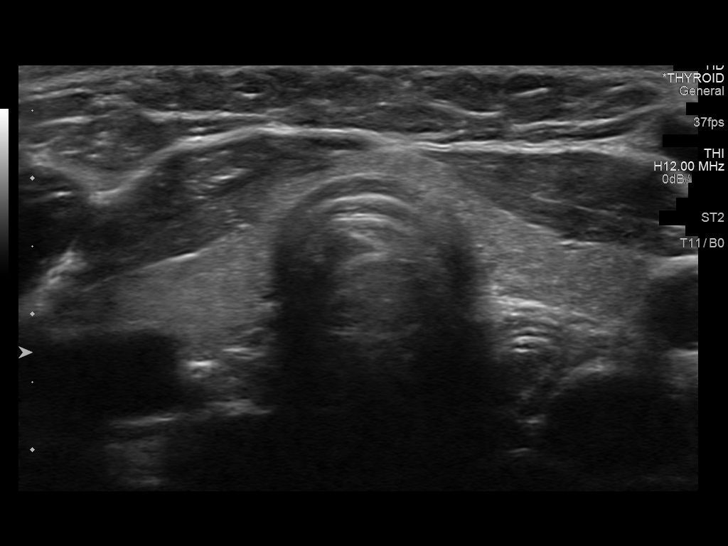
[im 3/21]
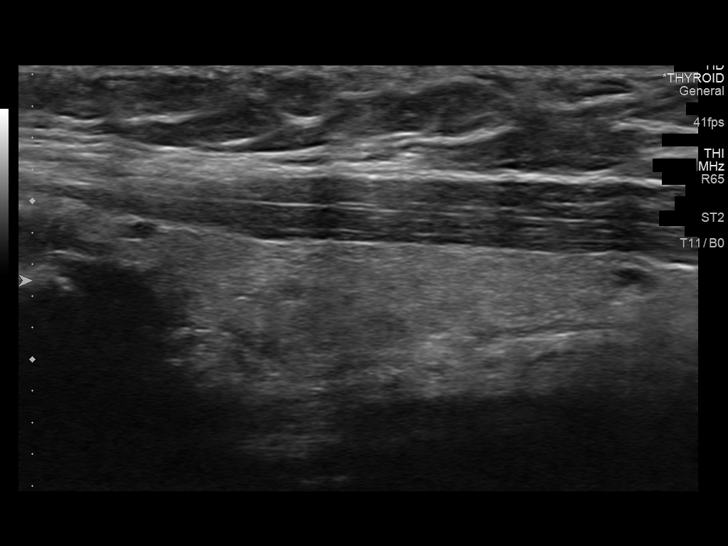
[im 4/21]
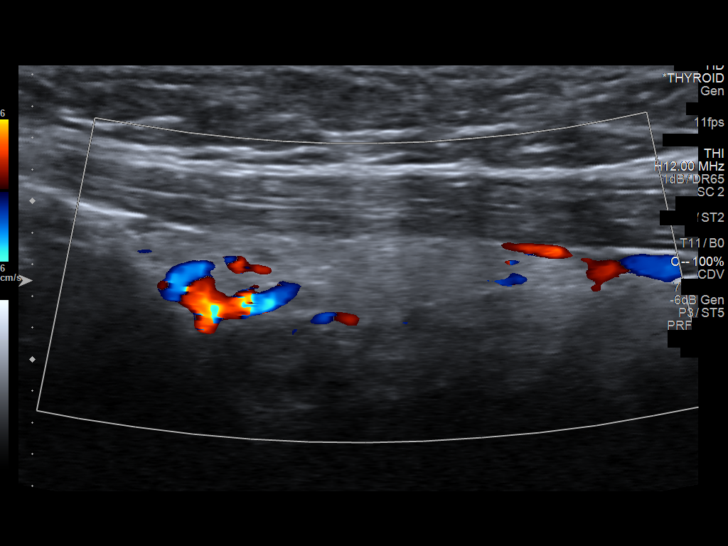
[im 6/21]
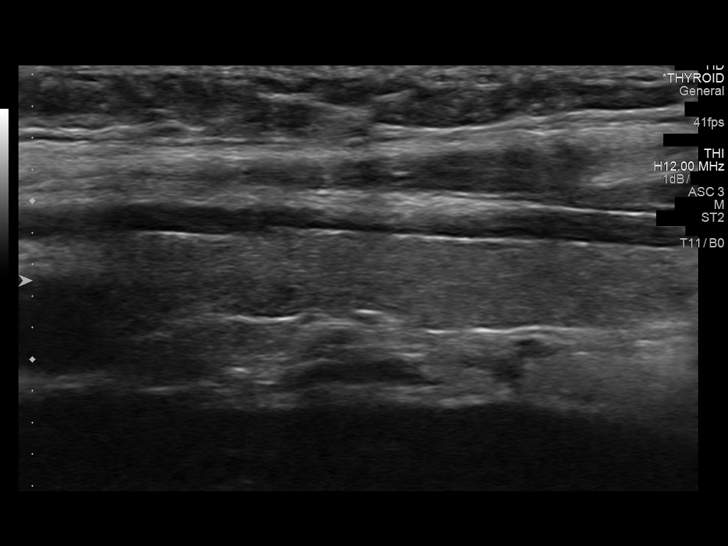
[im 7/21]
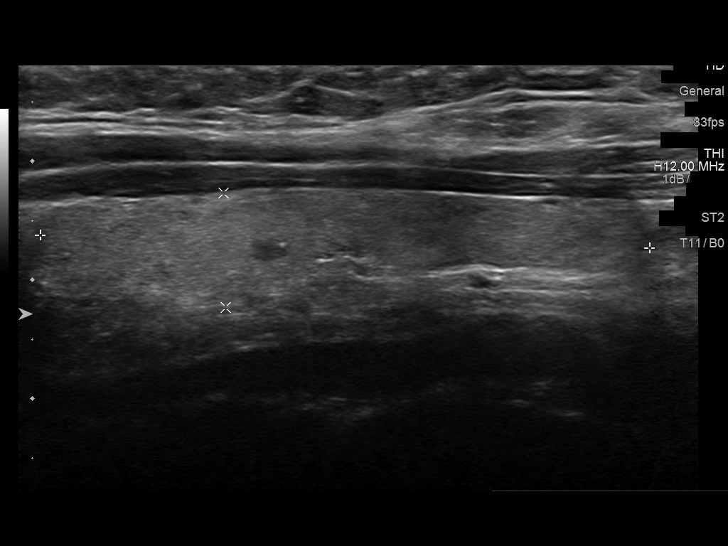
[im 9/21]
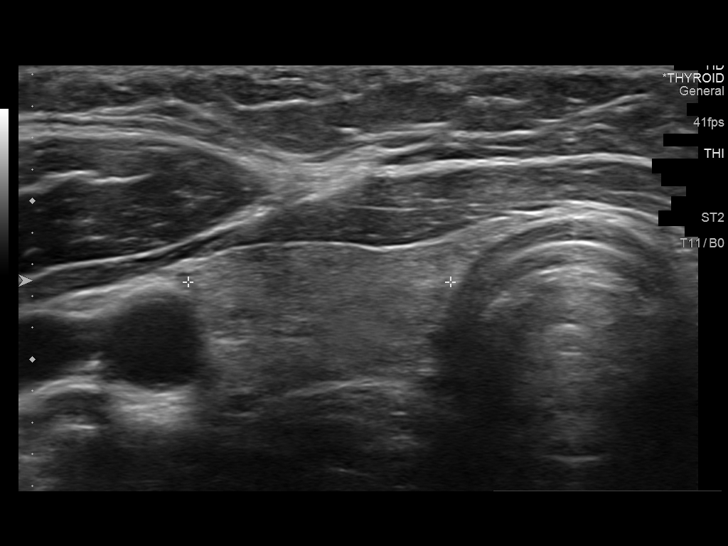
[im 10/21]
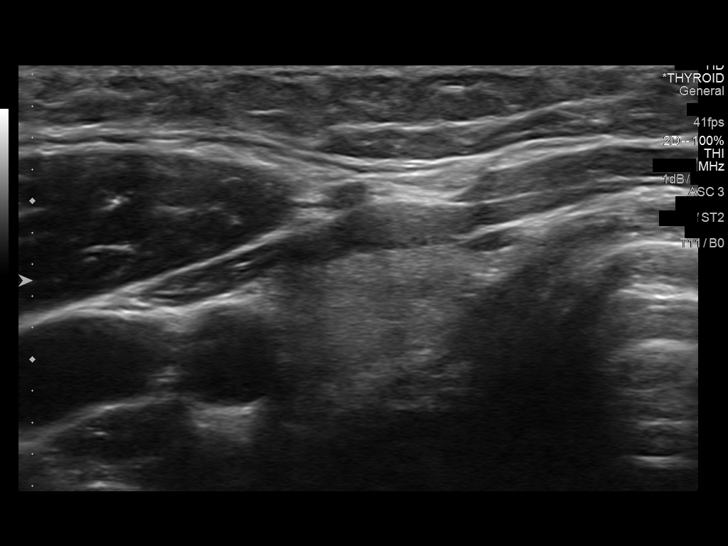
[im 12/21]
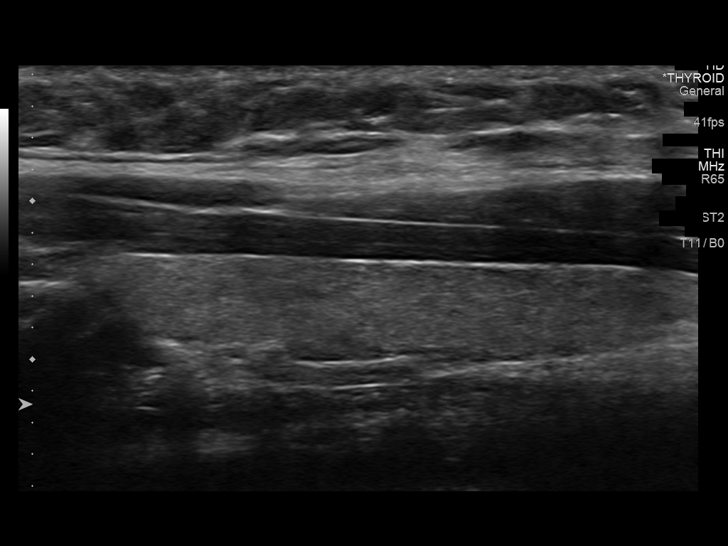
[im 13/21]
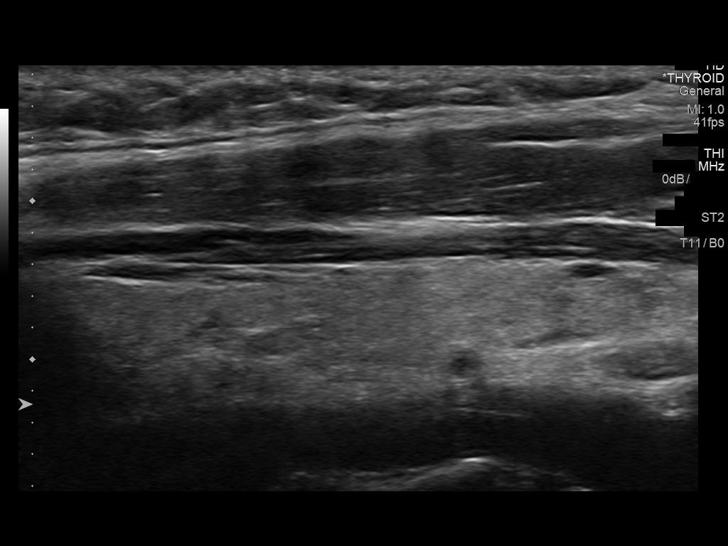
[im 15/21]
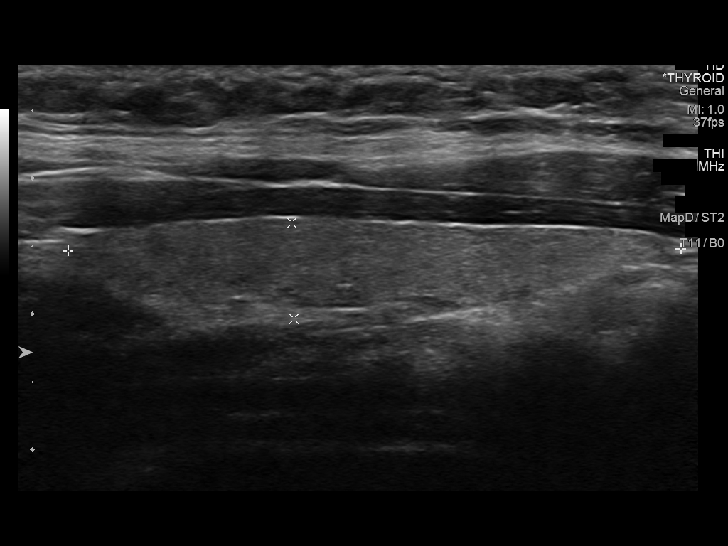
[im 16/21]
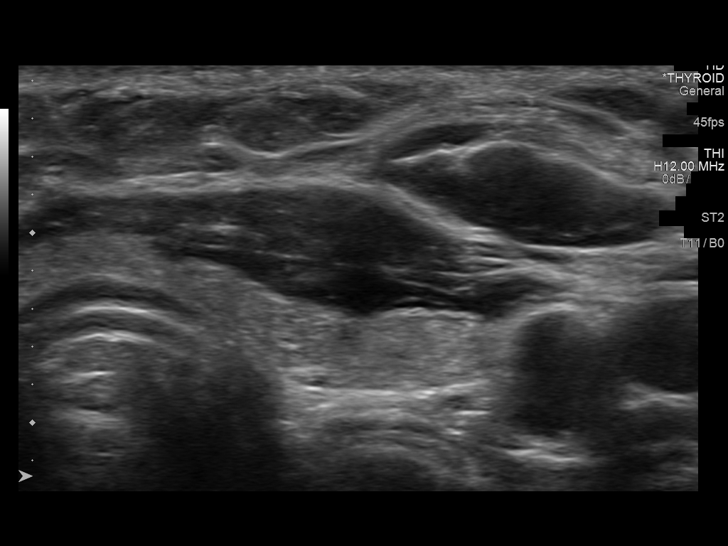
[im 18/21]
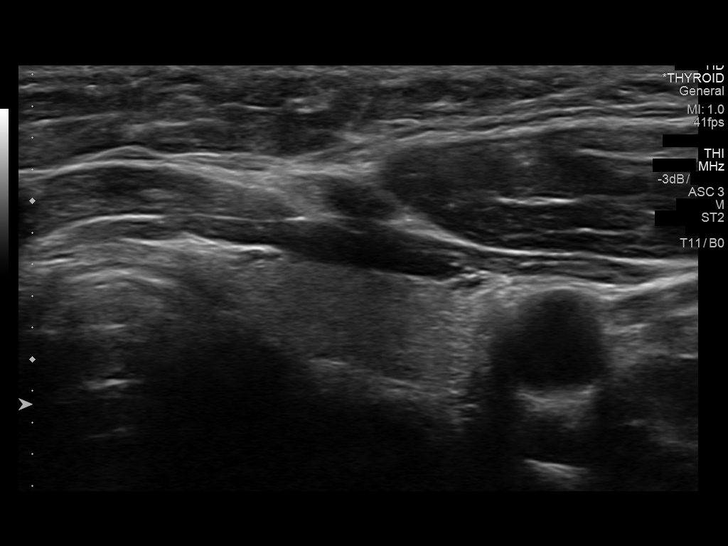
[im 19/21]
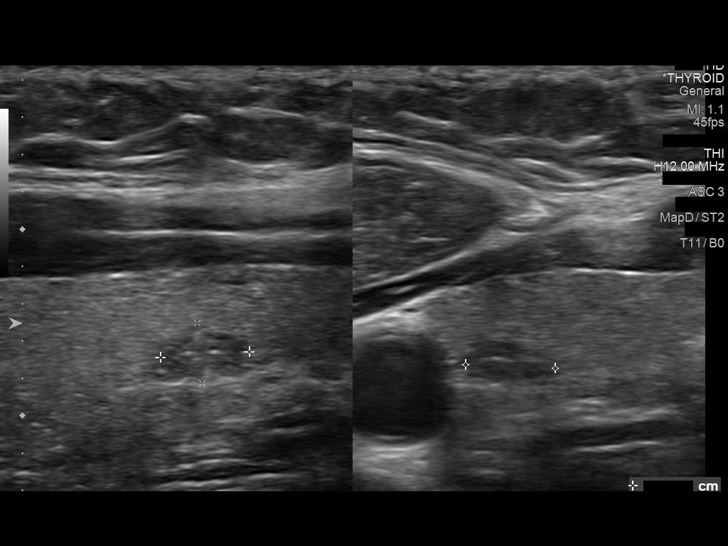
[im 21/21]
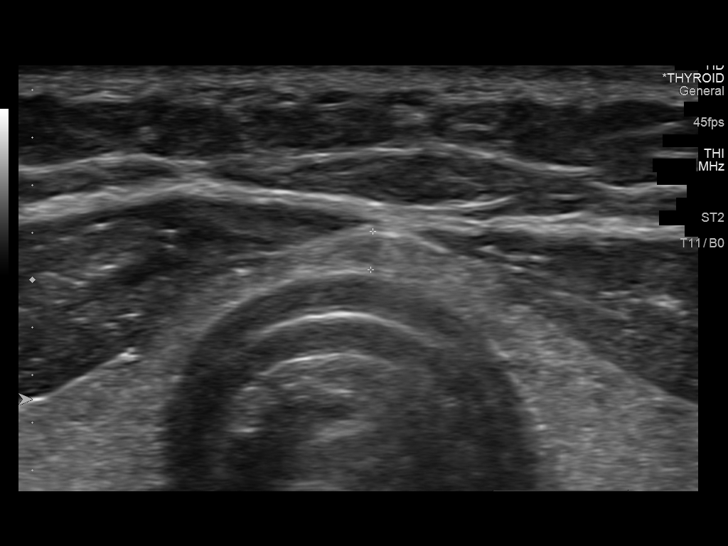

[14 of 21 positions shown; findings below may reference images not displayed]

FINDINGS: Right thyroid lobe

Measurements: 5.1 x 1.0 x 1.7 cm (slightly longer measured length).
Multiple tiny hypoechoic and cystic nodules are identified. The
largest measures 0.5 cm in the mid right lobe and appears stable.

Left thyroid lobe

Measurements: 4.5 x 0.7 x 1.6 cm (stable size). No nodules
visualized.

Isthmus

Thickness: 0.2 cm.  No nodules visualized.

Lymphadenopathy

None visualized.
IMPRESSION: Stable small right thyroid nodules with the largest again measuring
0.5 cm and having a stable appearance.

## 2017-10-09 ENCOUNTER — Ambulatory Visit: Payer: BLUE CROSS/BLUE SHIELD | Admitting: Family Medicine

## 2017-10-09 ENCOUNTER — Other Ambulatory Visit: Payer: Self-pay

## 2017-10-09 ENCOUNTER — Encounter: Payer: Self-pay | Admitting: Family Medicine

## 2017-10-09 VITALS — BP 128/82 | HR 102 | Temp 98.0°F | Resp 16 | Ht 67.0 in | Wt 217.4 lb

## 2017-10-09 DIAGNOSIS — E785 Hyperlipidemia, unspecified: Secondary | ICD-10-CM | POA: Insufficient documentation

## 2017-10-09 DIAGNOSIS — I10 Essential (primary) hypertension: Secondary | ICD-10-CM | POA: Diagnosis not present

## 2017-10-09 DIAGNOSIS — M5432 Sciatica, left side: Secondary | ICD-10-CM | POA: Diagnosis not present

## 2017-10-09 DIAGNOSIS — E1169 Type 2 diabetes mellitus with other specified complication: Secondary | ICD-10-CM | POA: Insufficient documentation

## 2017-10-09 LAB — CBC WITH DIFFERENTIAL/PLATELET
BASOS ABS: 0 10*3/uL (ref 0.0–0.1)
BASOS PCT: 0.5 % (ref 0.0–3.0)
Eosinophils Absolute: 0.1 10*3/uL (ref 0.0–0.7)
Eosinophils Relative: 1.1 % (ref 0.0–5.0)
HCT: 37.6 % (ref 36.0–46.0)
HEMOGLOBIN: 12.2 g/dL (ref 12.0–15.0)
LYMPHS PCT: 30.1 % (ref 12.0–46.0)
Lymphs Abs: 1.9 10*3/uL (ref 0.7–4.0)
MCHC: 32.5 g/dL (ref 30.0–36.0)
MCV: 83.9 fl (ref 78.0–100.0)
MONO ABS: 0.6 10*3/uL (ref 0.1–1.0)
MONOS PCT: 9.9 % (ref 3.0–12.0)
Neutro Abs: 3.7 10*3/uL (ref 1.4–7.7)
Neutrophils Relative %: 58.4 % (ref 43.0–77.0)
PLATELETS: 328 10*3/uL (ref 150.0–400.0)
RBC: 4.48 Mil/uL (ref 3.87–5.11)
RDW: 15 % (ref 11.5–15.5)
WBC: 6.3 10*3/uL (ref 4.0–10.5)

## 2017-10-09 LAB — HEPATIC FUNCTION PANEL
ALT: 17 U/L (ref 0–35)
AST: 21 U/L (ref 0–37)
Albumin: 4.3 g/dL (ref 3.5–5.2)
Alkaline Phosphatase: 85 U/L (ref 39–117)
BILIRUBIN DIRECT: 0.1 mg/dL (ref 0.0–0.3)
BILIRUBIN TOTAL: 0.4 mg/dL (ref 0.2–1.2)
Total Protein: 7 g/dL (ref 6.0–8.3)

## 2017-10-09 LAB — BASIC METABOLIC PANEL
BUN: 7 mg/dL (ref 6–23)
CALCIUM: 9.4 mg/dL (ref 8.4–10.5)
CO2: 23 mEq/L (ref 19–32)
Chloride: 103 mEq/L (ref 96–112)
Creatinine, Ser: 0.81 mg/dL (ref 0.40–1.20)
GFR: 101.62 mL/min (ref >60.00)
GLUCOSE: 90 mg/dL (ref 70–99)
Potassium: 3.6 mEq/L (ref 3.5–5.1)
SODIUM: 137 meq/L (ref 135–145)

## 2017-10-09 LAB — LIPID PANEL
CHOLESTEROL: 200 mg/dL (ref 0–200)
HDL: 35.3 mg/dL — ABNORMAL LOW (ref >39.00)
LDL Cholesterol: 145 mg/dL — ABNORMAL HIGH (ref 0–99)
NONHDL: 164.5
Total CHOL/HDL Ratio: 6
Triglycerides: 99 mg/dL (ref 0.0–149.0)
VLDL: 19.8 mg/dL (ref 0.0–40.0)

## 2017-10-09 LAB — TSH: TSH: 3.06 u[IU]/mL (ref 0.35–4.50)

## 2017-10-09 MED ORDER — PREDNISONE 10 MG PO TABS: ORAL_TABLET | ORAL | 0 refills | Status: DC

## 2017-10-09 MED ORDER — CYCLOBENZAPRINE HCL 10 MG PO TABS: 10.0000 mg | ORAL_TABLET | Freq: Three times a day (TID) | ORAL | 0 refills | Status: DC | PRN

## 2017-10-09 NOTE — Assessment & Plan Note (Signed)
Pt has hx of elevated cholesterol.  She was going to work on [employer] and regular exercise but that was 2 yrs ago.  Check labs.  Start meds prn.

## 2017-10-09 NOTE — Assessment & Plan Note (Signed)
Chronic problem.  Adequate control.  Asymptomatic.  Check labs.  No anticipated med changes.  Will follow. 

## 2017-10-09 NOTE — Progress Notes (Signed)
   Subjective:    Patient ID: Frances Randolph, female    DOB: 11/16/1980, 37 y.o.   MRN: [id number]  HPI HTN- chronic problem, adequate control on Amlodipine  daily, Benazepril HCT 20/12.5mg  daily.  Denies CP, SOB, HAs, visual changes, edema.  Hyperlipidemia- chronic problem, pt was going to work on diet and exercise for 6 months but has not been seen in 2 yrs.  No abd pain, N/V.  L sided low back and leg pain- pt works from home and sits w/ L leg crossed over R in a 4 position.  No known injury.  Having pain over sciatic notch, will radiate into L leg.  Pain w/ excessive walking and standing.  Sxs started in April- got a massage w/o improvement.  ASA 'makes it bearable'.  Muscle relaxers 'only mask pain'.   Review of Systems For ROS see HPI     Objective:   Physical Exam  Constitutional: She is oriented to person, place, and time. She appears well-developed and well-nourished. No distress.  HENT:  Head: Normocephalic and atraumatic.  Eyes: Pupils are equal, round, and reactive to light. Conjunctivae and EOM are normal.  Neck: Normal range of motion. Neck supple. Thyromegaly present.  Cardiovascular: Normal rate, regular rhythm, normal heart sounds and intact distal pulses.  No murmur heard. Pulmonary/Chest: Effort normal and breath sounds normal. No respiratory distress.  Abdominal: Soft. She exhibits no distension. There is no tenderness.  Musculoskeletal: She exhibits tenderness (TTP over L sciatic notch). She exhibits no edema.  Lymphadenopathy:    She has no cervical adenopathy.  Neurological: She is alert and oriented to person, place, and time. She displays normal reflexes. No cranial nerve deficit or sensory deficit. Coordination normal.  + SLR on L, (-) SLR on R  Skin: Skin is warm and dry.  Psychiatric: She has a normal mood and affect. Her behavior is normal.  Vitals reviewed.         Assessment & Plan:

## 2017-10-09 NOTE — Patient Instructions (Signed)
Schedule your complete physical in 6 months We'll notify you of your lab results and make any changes if needed START the Prednisone as directed- take w/ food.  3 pills at the same time x3 days, and then 2 pills, etc AVOID any additional Aspirin, Aleve, Ibuprofen, Advil, etc while on the Prednisone but you can add Tylenol (Acetaminophen) for breakthrough pain Use the cyclobenzaprine at night as needed for pain/spasm HEAT! If no improvement, please let me know and we'll send you to physical therapy Call with any questions or concerns Hang in there!

## 2017-10-12 ENCOUNTER — Encounter: Payer: Self-pay | Admitting: General Practice

## 2017-10-26 DIAGNOSIS — M9902 Segmental and somatic dysfunction of thoracic region: Secondary | ICD-10-CM | POA: Diagnosis not present

## 2017-10-26 DIAGNOSIS — M5442 Lumbago with sciatica, left side: Secondary | ICD-10-CM | POA: Diagnosis not present

## 2017-10-26 DIAGNOSIS — M9905 Segmental and somatic dysfunction of pelvic region: Secondary | ICD-10-CM | POA: Diagnosis not present

## 2017-10-26 DIAGNOSIS — M9903 Segmental and somatic dysfunction of lumbar region: Secondary | ICD-10-CM | POA: Diagnosis not present

## 2017-10-28 DIAGNOSIS — M9905 Segmental and somatic dysfunction of pelvic region: Secondary | ICD-10-CM | POA: Diagnosis not present

## 2017-10-28 DIAGNOSIS — M9902 Segmental and somatic dysfunction of thoracic region: Secondary | ICD-10-CM | POA: Diagnosis not present

## 2017-10-28 DIAGNOSIS — M9903 Segmental and somatic dysfunction of lumbar region: Secondary | ICD-10-CM | POA: Diagnosis not present

## 2017-10-28 DIAGNOSIS — M5442 Lumbago with sciatica, left side: Secondary | ICD-10-CM | POA: Diagnosis not present

## 2017-11-09 DIAGNOSIS — M5442 Lumbago with sciatica, left side: Secondary | ICD-10-CM | POA: Diagnosis not present

## 2017-11-09 DIAGNOSIS — M9903 Segmental and somatic dysfunction of lumbar region: Secondary | ICD-10-CM | POA: Diagnosis not present

## 2017-11-09 DIAGNOSIS — M9905 Segmental and somatic dysfunction of pelvic region: Secondary | ICD-10-CM | POA: Diagnosis not present

## 2017-11-09 DIAGNOSIS — M9902 Segmental and somatic dysfunction of thoracic region: Secondary | ICD-10-CM | POA: Diagnosis not present

## 2017-11-12 DIAGNOSIS — M9905 Segmental and somatic dysfunction of pelvic region: Secondary | ICD-10-CM | POA: Diagnosis not present

## 2017-11-12 DIAGNOSIS — M9902 Segmental and somatic dysfunction of thoracic region: Secondary | ICD-10-CM | POA: Diagnosis not present

## 2017-11-12 DIAGNOSIS — M5442 Lumbago with sciatica, left side: Secondary | ICD-10-CM | POA: Diagnosis not present

## 2017-11-12 DIAGNOSIS — M9903 Segmental and somatic dysfunction of lumbar region: Secondary | ICD-10-CM | POA: Diagnosis not present

## 2017-11-16 ENCOUNTER — Other Ambulatory Visit: Payer: Self-pay

## 2017-11-16 ENCOUNTER — Encounter: Payer: Self-pay | Admitting: Family Medicine

## 2017-11-16 ENCOUNTER — Ambulatory Visit: Payer: BLUE CROSS/BLUE SHIELD | Admitting: Family Medicine

## 2017-11-16 VITALS — BP 132/90 | HR 11 | Temp 99.0°F | Resp 16 | Ht 67.0 in | Wt 218.2 lb

## 2017-11-16 DIAGNOSIS — M5410 Radiculopathy, site unspecified: Secondary | ICD-10-CM

## 2017-11-16 MED ORDER — CYCLOBENZAPRINE HCL 10 MG PO TABS: 10.0000 mg | ORAL_TABLET | Freq: Three times a day (TID) | ORAL | 0 refills | Status: DC | PRN

## 2017-11-16 MED ORDER — MELOXICAM 15 MG PO TABS: 15.0000 mg | ORAL_TABLET | Freq: Every day | ORAL | 1 refills | Status: DC

## 2017-11-16 MED ORDER — AMLODIPINE BESYLATE 10 MG PO TABS: 10.0000 mg | ORAL_TABLET | Freq: Every day | ORAL | 1 refills | Status: DC

## 2017-11-16 MED ORDER — BENAZEPRIL-HYDROCHLOROTHIAZIDE 20-12.5 MG PO TABS: 2.0000 | ORAL_TABLET | Freq: Every day | ORAL | 1 refills | Status: DC

## 2017-11-16 NOTE — Progress Notes (Signed)
   Subjective:    Patient ID: Frances Randolph, female    DOB: [birth year], 37 y.o.   MRN: [id number]  HPI Back pain- pt was seen 5/3 and dx'd w/ radicular LBP.  Pt reports pain improved on Prednisone and Flexeril but then returned.  Pt had sensation that 'back was going out yesterday'.  Pt is now seeing a chiropractor- has 5th visit upcoming.  Pain improved today w/ BC and Flexeril.  Pain returned yesterday after bending to pick up a box.   Review of Systems For ROS see HPI     Objective:   Physical Exam  Constitutional: She is oriented to person, place, and time. She appears well-developed and well-nourished. No distress.  Musculoskeletal: She exhibits tenderness (TTP over L lumbar paraspinal muscle and L glute). She exhibits no edema.  Neurological: She is alert and oriented to person, place, and time. She displays normal reflexes. No cranial nerve deficit. Coordination normal.  + SLR on L, (-) SLR on R  Skin: Skin is warm and dry.  Psychiatric: She has a normal mood and affect. Her behavior is normal. Thought content normal.  Vitals reviewed.         Assessment & Plan:  Radicular low back pain- sxs improved w/ Prednisone but returned as the prednisone tapered down.  Pain improved w/ BC and flexeril today.  No relief w/ 4 chiropractic appts.  She does not want another pred taper.  Will refer to Sports Med, start daily Meloxicam, and refill Flexeril for PRN use.  Reviewed supportive care and red flags that should prompt return.  Pt expressed understanding and is in agreement w/ plan.

## 2017-11-16 NOTE — Patient Instructions (Signed)
Follow up as scheduled or as needed We'll call you with your Sports Medicine appt START the once daily Meloxicam- do not take any additional ibuprofen, aleve, Goody's or BCs (but you can add tylenol as needed) Continue to heat your low back Hold on your chiropractic appts until after sports medicine Use the Flexeril as needed for spasm/pain relief Call with any questions or concerns Hang in there!!

## 2017-11-17 ENCOUNTER — Telehealth: Payer: Self-pay | Admitting: Family Medicine

## 2017-11-17 DIAGNOSIS — M5442 Lumbago with sciatica, left side: Secondary | ICD-10-CM | POA: Diagnosis not present

## 2017-11-17 DIAGNOSIS — M9902 Segmental and somatic dysfunction of thoracic region: Secondary | ICD-10-CM | POA: Diagnosis not present

## 2017-11-17 DIAGNOSIS — M9905 Segmental and somatic dysfunction of pelvic region: Secondary | ICD-10-CM | POA: Diagnosis not present

## 2017-11-17 DIAGNOSIS — M9903 Segmental and somatic dysfunction of lumbar region: Secondary | ICD-10-CM | POA: Diagnosis not present

## 2017-11-17 NOTE — Telephone Encounter (Signed)
Called and spoke with pt, she has spoke to [facility], they will call her back to schedule an appt

## 2017-11-17 NOTE — Telephone Encounter (Signed)
Copied from CRM [id number]. Topic: Referral - Status >> Nov 17, 2017  8:47 AM Rudi CocoLathan, Juda Toepfer M, NT wrote: Reason for CRM:pt. Calling to check on the status of referral placed to [facility]. Pt. Wanted to see if she could be seen today due to being off work.

## 2017-11-20 DIAGNOSIS — M9903 Segmental and somatic dysfunction of lumbar region: Secondary | ICD-10-CM | POA: Diagnosis not present

## 2017-11-20 DIAGNOSIS — M9905 Segmental and somatic dysfunction of pelvic region: Secondary | ICD-10-CM | POA: Diagnosis not present

## 2017-11-20 DIAGNOSIS — M9902 Segmental and somatic dysfunction of thoracic region: Secondary | ICD-10-CM | POA: Diagnosis not present

## 2017-11-20 DIAGNOSIS — M5442 Lumbago with sciatica, left side: Secondary | ICD-10-CM | POA: Diagnosis not present

## 2017-11-24 DIAGNOSIS — M5442 Lumbago with sciatica, left side: Secondary | ICD-10-CM | POA: Diagnosis not present

## 2017-11-24 DIAGNOSIS — M9902 Segmental and somatic dysfunction of thoracic region: Secondary | ICD-10-CM | POA: Diagnosis not present

## 2017-11-24 DIAGNOSIS — M9905 Segmental and somatic dysfunction of pelvic region: Secondary | ICD-10-CM | POA: Diagnosis not present

## 2017-11-24 DIAGNOSIS — M9903 Segmental and somatic dysfunction of lumbar region: Secondary | ICD-10-CM | POA: Diagnosis not present

## 2017-11-27 DIAGNOSIS — M9905 Segmental and somatic dysfunction of pelvic region: Secondary | ICD-10-CM | POA: Diagnosis not present

## 2017-11-27 DIAGNOSIS — M9903 Segmental and somatic dysfunction of lumbar region: Secondary | ICD-10-CM | POA: Diagnosis not present

## 2017-11-27 DIAGNOSIS — M9902 Segmental and somatic dysfunction of thoracic region: Secondary | ICD-10-CM | POA: Diagnosis not present

## 2017-11-27 DIAGNOSIS — M5442 Lumbago with sciatica, left side: Secondary | ICD-10-CM | POA: Diagnosis not present

## 2017-12-01 DIAGNOSIS — M9903 Segmental and somatic dysfunction of lumbar region: Secondary | ICD-10-CM | POA: Diagnosis not present

## 2017-12-01 DIAGNOSIS — M5442 Lumbago with sciatica, left side: Secondary | ICD-10-CM | POA: Diagnosis not present

## 2017-12-01 DIAGNOSIS — M9902 Segmental and somatic dysfunction of thoracic region: Secondary | ICD-10-CM | POA: Diagnosis not present

## 2017-12-01 DIAGNOSIS — M9905 Segmental and somatic dysfunction of pelvic region: Secondary | ICD-10-CM | POA: Diagnosis not present

## 2017-12-04 DIAGNOSIS — M9903 Segmental and somatic dysfunction of lumbar region: Secondary | ICD-10-CM | POA: Diagnosis not present

## 2017-12-04 DIAGNOSIS — M9902 Segmental and somatic dysfunction of thoracic region: Secondary | ICD-10-CM | POA: Diagnosis not present

## 2017-12-04 DIAGNOSIS — M5442 Lumbago with sciatica, left side: Secondary | ICD-10-CM | POA: Diagnosis not present

## 2017-12-04 DIAGNOSIS — M9905 Segmental and somatic dysfunction of pelvic region: Secondary | ICD-10-CM | POA: Diagnosis not present

## 2017-12-08 DIAGNOSIS — M5442 Lumbago with sciatica, left side: Secondary | ICD-10-CM | POA: Diagnosis not present

## 2017-12-08 DIAGNOSIS — M9903 Segmental and somatic dysfunction of lumbar region: Secondary | ICD-10-CM | POA: Diagnosis not present

## 2017-12-08 DIAGNOSIS — M9902 Segmental and somatic dysfunction of thoracic region: Secondary | ICD-10-CM | POA: Diagnosis not present

## 2017-12-08 DIAGNOSIS — M9905 Segmental and somatic dysfunction of pelvic region: Secondary | ICD-10-CM | POA: Diagnosis not present

## 2017-12-14 DIAGNOSIS — M9905 Segmental and somatic dysfunction of pelvic region: Secondary | ICD-10-CM | POA: Diagnosis not present

## 2017-12-14 DIAGNOSIS — M9903 Segmental and somatic dysfunction of lumbar region: Secondary | ICD-10-CM | POA: Diagnosis not present

## 2017-12-14 DIAGNOSIS — M5442 Lumbago with sciatica, left side: Secondary | ICD-10-CM | POA: Diagnosis not present

## 2017-12-14 DIAGNOSIS — M9902 Segmental and somatic dysfunction of thoracic region: Secondary | ICD-10-CM | POA: Diagnosis not present

## 2017-12-17 DIAGNOSIS — M9903 Segmental and somatic dysfunction of lumbar region: Secondary | ICD-10-CM | POA: Diagnosis not present

## 2017-12-17 DIAGNOSIS — M9905 Segmental and somatic dysfunction of pelvic region: Secondary | ICD-10-CM | POA: Diagnosis not present

## 2017-12-17 DIAGNOSIS — M9902 Segmental and somatic dysfunction of thoracic region: Secondary | ICD-10-CM | POA: Diagnosis not present

## 2017-12-17 DIAGNOSIS — M5442 Lumbago with sciatica, left side: Secondary | ICD-10-CM | POA: Diagnosis not present

## 2017-12-22 DIAGNOSIS — M9902 Segmental and somatic dysfunction of thoracic region: Secondary | ICD-10-CM | POA: Diagnosis not present

## 2017-12-22 DIAGNOSIS — M9903 Segmental and somatic dysfunction of lumbar region: Secondary | ICD-10-CM | POA: Diagnosis not present

## 2017-12-22 DIAGNOSIS — M9905 Segmental and somatic dysfunction of pelvic region: Secondary | ICD-10-CM | POA: Diagnosis not present

## 2017-12-22 DIAGNOSIS — M5442 Lumbago with sciatica, left side: Secondary | ICD-10-CM | POA: Diagnosis not present

## 2017-12-24 DIAGNOSIS — M9903 Segmental and somatic dysfunction of lumbar region: Secondary | ICD-10-CM | POA: Diagnosis not present

## 2017-12-24 DIAGNOSIS — M9905 Segmental and somatic dysfunction of pelvic region: Secondary | ICD-10-CM | POA: Diagnosis not present

## 2017-12-24 DIAGNOSIS — M5442 Lumbago with sciatica, left side: Secondary | ICD-10-CM | POA: Diagnosis not present

## 2017-12-24 DIAGNOSIS — M9902 Segmental and somatic dysfunction of thoracic region: Secondary | ICD-10-CM | POA: Diagnosis not present

## 2017-12-28 ENCOUNTER — Ambulatory Visit: Payer: BLUE CROSS/BLUE SHIELD | Admitting: Family Medicine

## 2017-12-28 ENCOUNTER — Ambulatory Visit (INDEPENDENT_AMBULATORY_CARE_PROVIDER_SITE_OTHER): Payer: BLUE CROSS/BLUE SHIELD

## 2017-12-28 VITALS — BP 132/96 | HR 106 | Resp 16 | Wt 225.0 lb

## 2017-12-28 DIAGNOSIS — M5442 Lumbago with sciatica, left side: Secondary | ICD-10-CM

## 2017-12-28 DIAGNOSIS — G8929 Other chronic pain: Secondary | ICD-10-CM

## 2017-12-28 DIAGNOSIS — M545 Low back pain: Secondary | ICD-10-CM | POA: Diagnosis not present

## 2017-12-28 MED ORDER — GABAPENTIN 100 MG PO CAPS: 100.0000 mg | ORAL_CAPSULE | Freq: Three times a day (TID) | ORAL | 3 refills | Status: DC

## 2017-12-28 MED ORDER — CYCLOBENZAPRINE HCL 10 MG PO TABS: 10.0000 mg | ORAL_TABLET | Freq: Three times a day (TID) | ORAL | 1 refills | Status: DC | PRN

## 2017-12-28 NOTE — Assessment & Plan Note (Addendum)
Pain seems to be radicular in nature with component of spasm.  Pain is been increasing and becoming more frequent.  Has been occurring since March with limited improvement with conservative measures. -Continue meloxicam and refilled Flexeril. -Initiate gabapentin -X-ray -Counseled on home exercise therapy -MRI lumbar spine to evaluate for nerve impingement or bulging disc.  May need to consider epidural if no improvement.

## 2017-12-28 NOTE — Patient Instructions (Addendum)
Nice to meet you  I will call you with the results from today  Please try the exercises if your pain is less than 2/10  Please try heat or ice  Please try to avoid sitting for long periods of time  Please try the gabapentin.  Please start with 1 pill at night.  You can increase this to 2 and 3 pills daily as you tolerate.  This can cause drowsiness. Continue taking the muscle relaxer and anti-inflammatory. Please follow-up with me in 4 to 6 weeks if your pain is not improved.

## 2017-12-28 NOTE — Progress Notes (Signed)
Frances Randolph - 37 y.o. female MRN [id number]  Date of birth: 08-Mar-1981  SUBJECTIVE:  Including CC & ROS.  Chief Complaint  Patient presents with  . Hip Pain    left hip pain. xs 4 months.     Frances Randolph is a 37 y.o. female that is presenting with low back pain with radicular symptoms.  The pain is becoming more constant.  It is severe and occurring on the left side with radicular symptoms on the posterior aspect of her leg.  She has tried steroids with some improvement.  The pain occurred when the steroids ran out.  Has also tried meloxicam and Flexeril.  Has not been taking these on a regular basis.  The pain is becoming more frequent with sitting.  The pain has regressed from going to her foot and stopping at her knee.  She does have to sit for long periods of time at her work and if she has to drive.  This is been ongoing since March.  The pain is sharp in severity.    Review of Systems  Constitutional: Negative for fever.  HENT: Negative for congestion.   Respiratory: Negative for cough.   Cardiovascular: Negative for chest pain.  Gastrointestinal: Negative for abdominal pain.  Musculoskeletal: Positive for back pain. Negative for gait problem.  Skin: Negative for color change.  Neurological: Negative for weakness.  Hematological: Negative for adenopathy.  Psychiatric/Behavioral: Negative for agitation.    HISTORY: Past Medical, Surgical, Social, and Family History Reviewed & Updated per EMR.   Pertinent Historical Findings include:  Past Medical History:  Diagnosis Date  . Anemia   . Hypertension     Past Surgical History:  Procedure Laterality Date  . BRAIN SURGERY      No Known Allergies  Family History  Problem Relation Age of Onset  . Hypertension Father   . Cancer Mother        type unknown     Social History   Socioeconomic History  . Marital status: Single    Spouse name: Not on file  . Number of children: Not on file  . Years of  education: Not on file  . Highest education level: Not on file  Occupational History  . Not on file  Social Needs  . Financial resource strain: Not on file  . Food insecurity:    Worry: Not on file    Inability: Not on file  . Transportation needs:    Medical: Not on file    Non-medical: Not on file  Tobacco Use  . Smoking status: Never Smoker  . Smokeless tobacco: Never Used  Substance and Sexual Activity  . Alcohol use: No  . Drug use: No  . Sexual activity: Not on file  Lifestyle  . Physical activity:    Days per week: Not on file    Minutes per session: Not on file  . Stress: Not on file  Relationships  . Social connections:    Talks on phone: Not on file    Gets together: Not on file    Attends religious service: Not on file    Active member of club or organization: Not on file    Attends meetings of clubs or organizations: Not on file    Relationship status: Not on file  . Intimate partner violence:    Fear of current or ex partner: Not on file    Emotionally abused: Not on file    Physically abused: Not on  file    Forced sexual activity: Not on file  Other Topics Concern  . Not on file  Social History Narrative   Lives with sister.     PHYSICAL EXAM:  VS: BP (!) 132/96   Pulse (!) 106   Resp 16   Wt 225 lb (102.1 kg)   SpO2 96%   BMI 35.24 kg/m  Physical Exam Gen: NAD, alert, cooperative with exam, well-appearing ENT: normal lips, normal nasal mucosa,  Eye: normal EOM, normal conjunctiva and lids CV:  no edema, +2 pedal pulses   Resp: no accessory muscle use, non-labored,  Skin: no rashes, no areas of induration  Neuro: normal tone, normal sensation to touch Psych:  normal insight, alert and oriented MSK:  Back:  No tenderness to palpation over the greater trochanter piriformis. Some tenderness to palpation of the left lower back. Significant weakness with hip abduction. Normal strength resistance with hip flexion, knee flexion extension,  plantarflexion and dorsiflexion. Negative Elmon KirschnerFeder and Pearlean BrownieFaber Positive straight leg on the left Normal gait Neurovascularly intact     ASSESSMENT & PLAN:   Chronic left-sided low back pain with left-sided sciatica Pain seems to be radicular in nature with component of spasm.  Pain is been increasing and becoming more frequent.  Has been occurring since March with limited improvement with conservative measures. -Continue meloxicam and refilled Flexeril. -Initiate gabapentin -X-ray -Counseled on home exercise therapy -MRI lumbar spine to evaluate for nerve impingement or bulging disc.  May need to consider epidural if no improvement.

## 2017-12-31 ENCOUNTER — Encounter: Payer: Self-pay | Admitting: Family Medicine

## 2017-12-31 DIAGNOSIS — M9903 Segmental and somatic dysfunction of lumbar region: Secondary | ICD-10-CM | POA: Diagnosis not present

## 2017-12-31 DIAGNOSIS — M5442 Lumbago with sciatica, left side: Secondary | ICD-10-CM | POA: Diagnosis not present

## 2017-12-31 DIAGNOSIS — M9902 Segmental and somatic dysfunction of thoracic region: Secondary | ICD-10-CM | POA: Diagnosis not present

## 2017-12-31 DIAGNOSIS — M9905 Segmental and somatic dysfunction of pelvic region: Secondary | ICD-10-CM | POA: Diagnosis not present

## 2018-01-01 ENCOUNTER — Telehealth: Payer: Self-pay

## 2018-01-01 NOTE — Telephone Encounter (Signed)
PEC called/[facility] with [facility] stating that the patient is scheduled for the MRI Dr. Jordan Randolph ordered on Sunday and it requires a Prior Authorization [employer] AIM: [phone] CPT: [id number]  Please fax outcome of PA to [phone]/thx dmf

## 2018-01-01 NOTE — Telephone Encounter (Signed)
Reviewed chart. It seems she sent a message back to the treating provider who has agreed to sign forms in PCP absence. Nothing further needed on our end.

## 2018-01-03 ENCOUNTER — Ambulatory Visit
Admission: RE | Admit: 2018-01-03 | Discharge: 2018-01-03 | Disposition: A | Discharge status: Home / Self Care | Payer: BLUE CROSS/BLUE SHIELD | Source: Ambulatory Visit | Attending: Family Medicine | Admitting: Family Medicine

## 2018-01-03 DIAGNOSIS — G8929 Other chronic pain: Secondary | ICD-10-CM

## 2018-01-03 DIAGNOSIS — M5442 Lumbago with sciatica, left side: Principal | ICD-10-CM

## 2018-01-03 DIAGNOSIS — M545 Low back pain: Secondary | ICD-10-CM | POA: Diagnosis not present

## 2018-01-04 ENCOUNTER — Telehealth: Payer: Self-pay | Admitting: Family Medicine

## 2018-01-04 ENCOUNTER — Encounter: Payer: Self-pay | Admitting: Family Medicine

## 2018-01-04 DIAGNOSIS — M5442 Lumbago with sciatica, left side: Principal | ICD-10-CM

## 2018-01-04 DIAGNOSIS — G8929 Other chronic pain: Secondary | ICD-10-CM

## 2018-01-04 NOTE — Telephone Encounter (Signed)
Spoke with patient about MRI results. Will try PT and send to neurosurgery.   Myra RudeSchmitz, Jeremy E, MD [facility] 01/04/2018, 9:52 AM

## 2018-01-04 NOTE — Telephone Encounter (Signed)
Pt called in and stated that [facility] Dr Thalia BloodgoodPaul Weiss. She also stated that she has an appt scheduled for 01/05/2018 at 5:30

## 2018-01-04 NOTE — Telephone Encounter (Signed)
Pre-cert was approved on 01/01/18.

## 2018-01-05 DIAGNOSIS — G8929 Other chronic pain: Secondary | ICD-10-CM | POA: Diagnosis not present

## 2018-01-05 DIAGNOSIS — M5442 Lumbago with sciatica, left side: Secondary | ICD-10-CM | POA: Diagnosis not present

## 2018-01-08 DIAGNOSIS — M5442 Lumbago with sciatica, left side: Secondary | ICD-10-CM | POA: Diagnosis not present

## 2018-01-08 DIAGNOSIS — G8929 Other chronic pain: Secondary | ICD-10-CM | POA: Diagnosis not present

## 2018-01-11 DIAGNOSIS — M5442 Lumbago with sciatica, left side: Secondary | ICD-10-CM | POA: Diagnosis not present

## 2018-01-11 DIAGNOSIS — G8929 Other chronic pain: Secondary | ICD-10-CM | POA: Diagnosis not present

## 2018-01-12 ENCOUNTER — Encounter: Payer: Self-pay | Admitting: Family Medicine

## 2018-01-13 ENCOUNTER — Encounter: Payer: Self-pay | Admitting: Family Medicine

## 2018-01-13 NOTE — Telephone Encounter (Unsigned)
Copied from CRM [id number]. Topic: Inquiry >> Jan 13, 2018  3:12 PM Tamela OddiMartin, Don'Quashia, NT wrote: Reason for CRM: patient called and states that Dr. Beverely Lowabori was going to write a letter [facility]. She states he sister is coming to pick that letter up. Her name is Joya SalmLashonda Ferrante

## 2018-01-18 DIAGNOSIS — M5442 Lumbago with sciatica, left side: Secondary | ICD-10-CM | POA: Diagnosis not present

## 2018-01-18 DIAGNOSIS — G8929 Other chronic pain: Secondary | ICD-10-CM | POA: Diagnosis not present

## 2018-01-20 ENCOUNTER — Other Ambulatory Visit: Payer: Self-pay | Admitting: Family Medicine

## 2018-01-20 NOTE — Telephone Encounter (Signed)
Last OV 11/16/17, Next OV 04/16/18  Last filled 11/16/17, # 30 with 1 refill  Please advise if okay to fill. Dr. Clare GandyJeremy Schmitz from sports medicine has filled Flexeril 10 mg and Gabapentin 100 mg

## 2018-01-21 DIAGNOSIS — M5442 Lumbago with sciatica, left side: Secondary | ICD-10-CM | POA: Diagnosis not present

## 2018-01-21 DIAGNOSIS — G8929 Other chronic pain: Secondary | ICD-10-CM | POA: Diagnosis not present

## 2018-01-26 DIAGNOSIS — M5442 Lumbago with sciatica, left side: Secondary | ICD-10-CM | POA: Diagnosis not present

## 2018-01-26 DIAGNOSIS — G8929 Other chronic pain: Secondary | ICD-10-CM | POA: Diagnosis not present

## 2018-02-03 ENCOUNTER — Other Ambulatory Visit: Payer: Self-pay | Admitting: Family Medicine

## 2018-02-04 ENCOUNTER — Other Ambulatory Visit: Payer: Self-pay | Admitting: Family Medicine

## 2018-02-04 MED ORDER — MELOXICAM 15 MG PO TABS: 15.0000 mg | ORAL_TABLET | Freq: Every day | ORAL | 1 refills | Status: DC

## 2018-02-04 NOTE — Addendum Note (Signed)
Addended by: Geannie RisenBRODMERKEL, JESSICA L on: 02/04/2018 02:41 PM   Modules accepted: Orders

## 2018-02-04 NOTE — Telephone Encounter (Signed)
Last OV 11/16/17 meloxicam last filled 01/20/18 #30 with 1  Pt would like #90

## 2018-02-09 DIAGNOSIS — M5442 Lumbago with sciatica, left side: Secondary | ICD-10-CM | POA: Diagnosis not present

## 2018-02-09 DIAGNOSIS — G8929 Other chronic pain: Secondary | ICD-10-CM | POA: Diagnosis not present

## 2018-02-11 DIAGNOSIS — M5442 Lumbago with sciatica, left side: Secondary | ICD-10-CM | POA: Diagnosis not present

## 2018-02-11 DIAGNOSIS — G8929 Other chronic pain: Secondary | ICD-10-CM | POA: Diagnosis not present

## 2018-02-16 DIAGNOSIS — G8929 Other chronic pain: Secondary | ICD-10-CM | POA: Diagnosis not present

## 2018-02-16 DIAGNOSIS — M5442 Lumbago with sciatica, left side: Secondary | ICD-10-CM | POA: Diagnosis not present

## 2018-02-18 DIAGNOSIS — M5442 Lumbago with sciatica, left side: Secondary | ICD-10-CM | POA: Diagnosis not present

## 2018-02-18 DIAGNOSIS — G8929 Other chronic pain: Secondary | ICD-10-CM | POA: Diagnosis not present

## 2018-02-23 DIAGNOSIS — G8929 Other chronic pain: Secondary | ICD-10-CM | POA: Diagnosis not present

## 2018-02-23 DIAGNOSIS — M5442 Lumbago with sciatica, left side: Secondary | ICD-10-CM | POA: Diagnosis not present

## 2018-02-25 ENCOUNTER — Encounter: Payer: Self-pay | Admitting: Family Medicine

## 2018-02-25 DIAGNOSIS — M4726 Other spondylosis with radiculopathy, lumbar region: Secondary | ICD-10-CM | POA: Diagnosis not present

## 2018-02-25 DIAGNOSIS — M5116 Intervertebral disc disorders with radiculopathy, lumbar region: Secondary | ICD-10-CM | POA: Diagnosis not present

## 2018-02-25 DIAGNOSIS — Z6834 Body mass index (BMI) 34.0-34.9, adult: Secondary | ICD-10-CM | POA: Diagnosis not present

## 2018-02-25 DIAGNOSIS — M5432 Sciatica, left side: Secondary | ICD-10-CM | POA: Diagnosis not present

## 2018-03-02 ENCOUNTER — Ambulatory Visit: Payer: Self-pay | Admitting: *Deleted

## 2018-03-02 ENCOUNTER — Other Ambulatory Visit: Payer: Self-pay

## 2018-03-02 ENCOUNTER — Encounter: Payer: Self-pay | Admitting: Physician Assistant

## 2018-03-02 ENCOUNTER — Ambulatory Visit: Payer: BLUE CROSS/BLUE SHIELD | Admitting: Physician Assistant

## 2018-03-02 VITALS — BP 122/88 | HR 117 | Temp 98.5°F | Resp 14 | Ht 67.0 in | Wt 215.0 lb

## 2018-03-02 DIAGNOSIS — I10 Essential (primary) hypertension: Secondary | ICD-10-CM

## 2018-03-02 LAB — TSH: TSH: 1.72 u[IU]/mL (ref 0.35–4.50)

## 2018-03-02 MED ORDER — METOPROLOL SUCCINATE ER 25 MG PO TB24: 12.5000 mg | ORAL_TABLET | Freq: Every day | ORAL | 0 refills | Status: DC

## 2018-03-02 NOTE — Patient Instructions (Addendum)
Please go to the lab today for blood work.  I will call you with your results. We will alter treatment regimen(s) if indicated by your results.   Start the low-dose Toprol XL in addition to you current regimen.  This will help to keep BP and heart rate at a better level.   Continue diet and exercise. Follow-up with Dr. Beverely Lowabori in 1 week for reassessment. Continue checking BP at home and recording. Bring to follow-up.

## 2018-03-02 NOTE — Telephone Encounter (Signed)
Pt reports BP consistently elevated. States "I'm not dizzy, just feel foggy and dull when it gets high." Reports this am 150/103 prior to am meds.;after meds during NT call, 145/87, HR 104. Pt states mild headache this am upon awakening, neck pain. Pt has H/O "Pinched nerve in neck." States BP has been consistently... high 140's/ high 80's.  At OV last week reports BP 160/96.  Denies any visual changes, dizziness, CP, weakness, SOB. No headache presently. Pt sounds anxious re: readings, has been checking BP multiple times this am. Appt made with Enid Skeens. Martin for this am. Instructed pt to bring home monitor to appt..Care advise given per protocol.  Reason for Disposition . Systolic BP  >= 160 OR Diastolic >= 100  Answer Assessment - Initial Assessment Questions 1. BLOOD PRESSURE: "What is the blood pressure?" "Did you take at least two measurements 5 minutes apart?"     Before meds this am 150/103; after meds during call, 145/87.  HR 104 2. ONSET: "When did you take your blood pressure?"     This am, several times; variable 3. HOW: "How did you obtain the blood pressure?" (e.g., visiting nurse, automatic home BP monitor)     Home monitor, arm cuff 4. HISTORY: "Do you have a history of high blood pressure?"    yes 5. MEDICATIONS: "Are you taking any medications for blood pressure? Yes, no missed doses in past 2 weeks. "Did miss a lot before that." 6. OTHER SYMPTOMS: "Do you have any symptoms?" (e.g., headache, chest pain, blurred vision, difficulty breathing, weakness)     This am mild headache, neck pain. "Pinched nerve in neck." 7. PREGNANCY: "Is there any chance you are pregnant?" "When was your last menstrual period?"     no  Protocols used: HIGH BLOOD PRESSURE-A-AH

## 2018-03-02 NOTE — Progress Notes (Signed)
Patient with history of HTN presents to clinic today c/o 1-2 weeks of elevated BP measurements associated with foggy headedness and headaches. Has noted BP being mildly elevated sometimes to 140-150/80-90 with medication, but at other times more significantly elevated in the 160s. Denies chest pain, SOB, palpitations, LH or dizziness. Has been working on diet lower in salt. Trying to keep hydrated. Denies change in stressors. .   Past Medical History:  Diagnosis Date  . Anemia   . Hypertension     Current Outpatient Medications on File Prior to Visit  Medication Sig Dispense Refill  . amLODipine (NORVASC) 10 MG tablet Take 1 tablet (10 mg total) by mouth daily. 90 tablet 1  . benazepril-hydrochlorthiazide (LOTENSIN HCT) 20-12.5 MG tablet Take 2 tablets by mouth daily. 180 tablet 1   No current facility-administered medications on file prior to visit.     No Known Allergies  Family History  Problem Relation Age of Onset  . Hypertension Father   . Cancer Mother        type unknown    Social History   Socioeconomic History  . Marital status: Single    Spouse name: Not on file  . Number of children: Not on file  . Years of education: Not on file  . Highest education level: Not on file  Occupational History  . Not on file  Social Needs  . Financial resource strain: Not on file  . Food insecurity:    Worry: Not on file    Inability: Not on file  . Transportation needs:    Medical: Not on file    Non-medical: Not on file  Tobacco Use  . Smoking status: Never Smoker  . Smokeless tobacco: Never Used  Substance and Sexual Activity  . Alcohol use: No  . Drug use: No  . Sexual activity: Not on file  Lifestyle  . Physical activity:    Days per week: Not on file    Minutes per session: Not on file  . Stress: Not on file  Relationships  . Social connections:    Talks on phone: Not on file    Gets together: Not on file    Attends religious service: Not on file   Active member of club or organization: Not on file    Attends meetings of clubs or organizations: Not on file    Relationship status: Not on file  Other Topics Concern  . Not on file  Social History Narrative   Lives with sister.   Review of Systems - See HPI.  All other ROS are negative.  BP 122/88   Pulse (!) 117   Temp 98.5 F (36.9 C) (Oral)   Resp 14   Ht 5\' 7"  (1.702 m)   Wt 215 lb (97.5 kg)   SpO2 99%   BMI 33.67 kg/m   Physical Exam  Constitutional: She is oriented to person, place, and time. She appears well-developed and well-nourished.  HENT:  Head: Normocephalic and atraumatic.  Eyes: Conjunctivae are normal.  Neck: Neck supple.  Cardiovascular: Regular rhythm, normal heart sounds and intact distal pulses.  Pulmonary/Chest: Effort normal and breath sounds normal.  Lymphadenopathy:    She has no cervical adenopathy.  Neurological: She is alert and oriented to person, place, and time.  Psychiatric: She has a normal mood and affect.  Vitals reviewed.  Assessment/Plan: 1. Essential hypertension Increased BP and mild tachycardia. Improved on recheck but numerous significantly elevated measurements at home. Home BP cuff checked  today in office and matched with manual readings. Will add on low-dose Toprol XL at 12.5 mg to current regimen. Continue DASH diet and exercise. Check TSH today. Follow-up 1 week with PCP for repeat assessment. - TSH - metoprolol succinate (TOPROL-XL) 25 MG 24 hr tablet; Take 0.5 tablets (12.5 mg total) by mouth daily.  Dispense: 30 tablet; Refill: 0   Piedad ClimesWilliam Cody Aleiyah Halpin, PA-C

## 2018-03-06 DIAGNOSIS — I10 Essential (primary) hypertension: Secondary | ICD-10-CM | POA: Diagnosis not present

## 2018-03-08 ENCOUNTER — Ambulatory Visit: Payer: BLUE CROSS/BLUE SHIELD | Admitting: Family Medicine

## 2018-03-08 ENCOUNTER — Other Ambulatory Visit: Payer: Self-pay

## 2018-03-08 ENCOUNTER — Encounter: Payer: Self-pay | Admitting: Family Medicine

## 2018-03-08 VITALS — BP 126/86 | HR 82 | Temp 98.0°F | Resp 16 | Ht 67.0 in | Wt 210.0 lb

## 2018-03-08 DIAGNOSIS — I10 Essential (primary) hypertension: Secondary | ICD-10-CM

## 2018-03-08 MED ORDER — BENAZEPRIL-HYDROCHLOROTHIAZIDE 20-25 MG PO TABS: 2.0000 | ORAL_TABLET | Freq: Every day | ORAL | 3 refills | Status: DC

## 2018-03-08 NOTE — Assessment & Plan Note (Signed)
Deteriorated.  Pt's BP continues to fluctuate.  She was unable to tolerate the Metoprolol b/c it made her feel dizzy and foggy.  She went to UC over the weekend and the Metoprolol was stopped and she was started on Benazepril HCTZ 20/25mg  BID in addition to Amlodipine.  Dizziness improved after stopping Metoprolol.  Currently feels well but is worried about the high dose of HCTZ.  Discussed need to monitor K+.  Pt will return in 1 week to assess BP and BMP.  Pt expressed understanding and is in agreement w/ plan.

## 2018-03-08 NOTE — Patient Instructions (Signed)
Follow up in 1 week to recheck BP and potassium Continue the Benazepril HCTZ twice daily Continue the Amlodipine once daily Call with any questions or concerns Happy Fall!!!

## 2018-03-08 NOTE — Progress Notes (Signed)
   Subjective:    Patient ID: Frances Randolph, female    DOB: 22-Nov-1980, 37 y.o.   MRN: [id number]  HPI HTN- pt was started on Metoprolol on 9/24 due to elevated BP.  Stopped medication on Saturday.  Pt saw UC over the weekend and stopped the Metoprolol due to side effects.  Continue the Amlodipine 10mg  daily and increased Benazepril to 20/25mg  BID.  Pt reports SBP was 'good' but DBP was consistently elevated.  No CP, SOB, HAs, edema, visual changes.   Review of Systems For ROS see HPI     Objective:   Physical Exam  Constitutional: She is oriented to person, place, and time. She appears well-developed and well-nourished. No distress.  HENT:  Head: Normocephalic and atraumatic.  Eyes: Pupils are equal, round, and reactive to light. Conjunctivae and EOM are normal.  Neck: Normal range of motion. Neck supple. No thyromegaly present.  Cardiovascular: Normal rate, regular rhythm, normal heart sounds and intact distal pulses.  No murmur heard. Pulmonary/Chest: Effort normal and breath sounds normal. No respiratory distress.  Abdominal: Soft. She exhibits no distension. There is no tenderness.  Musculoskeletal: She exhibits no edema.  Lymphadenopathy:    She has no cervical adenopathy.  Neurological: She is alert and oriented to person, place, and time.  Skin: Skin is warm and dry.  Psychiatric: She has a normal mood and affect. Her behavior is normal.  Vitals reviewed.         Assessment & Plan:

## 2018-03-09 DIAGNOSIS — M5432 Sciatica, left side: Secondary | ICD-10-CM | POA: Diagnosis not present

## 2018-03-09 DIAGNOSIS — M4726 Other spondylosis with radiculopathy, lumbar region: Secondary | ICD-10-CM | POA: Diagnosis not present

## 2018-03-15 ENCOUNTER — Encounter: Payer: Self-pay | Admitting: Family Medicine

## 2018-03-15 ENCOUNTER — Ambulatory Visit: Payer: BLUE CROSS/BLUE SHIELD | Admitting: Family Medicine

## 2018-03-15 ENCOUNTER — Other Ambulatory Visit: Payer: Self-pay

## 2018-03-15 VITALS — BP 120/83 | HR 93 | Temp 98.9°F | Resp 16 | Ht 67.0 in | Wt 220.2 lb

## 2018-03-15 DIAGNOSIS — I10 Essential (primary) hypertension: Secondary | ICD-10-CM | POA: Diagnosis not present

## 2018-03-15 LAB — BASIC METABOLIC PANEL
BUN: 4 mg/dL — AB (ref 6–23)
CO2: 30 meq/L (ref 19–32)
CREATININE: 0.82 mg/dL (ref 0.40–1.20)
Calcium: 9.7 mg/dL (ref 8.4–10.5)
Chloride: 99 mEq/L (ref 96–112)
GFR: 99.96 mL/min (ref >60.00)
GLUCOSE: 116 mg/dL — AB (ref 70–99)
Potassium: 3.7 mEq/L (ref 3.5–5.1)
Sodium: 136 mEq/L (ref 135–145)

## 2018-03-15 NOTE — Assessment & Plan Note (Signed)
Chronic problem.  Adequate control.  Currently asymptomatic w/ exception of weight gain.  Physically feeling much better.  Given much higher dose of HCTZ daily will need BMP to assess Cr and K+.  Pt expressed understanding and is in agreement w/ plan.

## 2018-03-15 NOTE — Progress Notes (Signed)
   Subjective:    Patient ID: Frances Randolph, female    DOB: Mar 22, 1981, 37 y.o.   MRN: [id number]  HPI HTN- chronic problem.  At last visit Benazepril HCTZ was increased to 20/25mg  BID in addition to her Amlodipine 10mg  daily.  BP is well controlled today but weight is up 10 lbs from 9/30 visit, 5 lbs since 9/24 visit.  Pt is currently on menses.  Pt is aware that she is retaining water but does much better w/ increased water intake.  'I feel WAY better'.  No CP, SOB, HAs, visual changes.   Review of Systems For ROS see HPI     Objective:   Physical Exam  Constitutional: She is oriented to person, place, and time. She appears well-developed and well-nourished. No distress.  HENT:  Head: Normocephalic and atraumatic.  Eyes: Pupils are equal, round, and reactive to light. Conjunctivae and EOM are normal.  Neck: Normal range of motion. Neck supple. No thyromegaly present.  Cardiovascular: Normal rate, regular rhythm, normal heart sounds and intact distal pulses.  No murmur heard. Pulmonary/Chest: Effort normal and breath sounds normal. No respiratory distress.  Abdominal: Soft. She exhibits no distension. There is no tenderness.  Musculoskeletal: She exhibits no edema.  Lymphadenopathy:    She has no cervical adenopathy.  Neurological: She is alert and oriented to person, place, and time.  Skin: Skin is warm and dry.  Psychiatric: She has a normal mood and affect. Her behavior is normal.  Vitals reviewed.         Assessment & Plan:

## 2018-03-15 NOTE — Patient Instructions (Signed)
Schedule your complete physical in 6 months We'll notify you of your lab results and make any changes if needed Continue to work on healthy diet, regular exercise- you can do it! Limit your salt intake to improve BP and swelling Call with any questions or concerns Happy Fall!!

## 2018-03-30 DIAGNOSIS — M5432 Sciatica, left side: Secondary | ICD-10-CM | POA: Diagnosis not present

## 2018-03-30 DIAGNOSIS — M4726 Other spondylosis with radiculopathy, lumbar region: Secondary | ICD-10-CM | POA: Diagnosis not present

## 2018-03-30 DIAGNOSIS — M5116 Intervertebral disc disorders with radiculopathy, lumbar region: Secondary | ICD-10-CM | POA: Diagnosis not present

## 2018-04-12 DIAGNOSIS — M5432 Sciatica, left side: Secondary | ICD-10-CM | POA: Diagnosis not present

## 2018-04-16 ENCOUNTER — Encounter: Payer: BLUE CROSS/BLUE SHIELD | Admitting: Family Medicine

## 2018-04-21 DIAGNOSIS — M5116 Intervertebral disc disorders with radiculopathy, lumbar region: Secondary | ICD-10-CM | POA: Diagnosis not present

## 2018-04-21 DIAGNOSIS — M5432 Sciatica, left side: Secondary | ICD-10-CM | POA: Diagnosis not present

## 2018-04-21 DIAGNOSIS — M4726 Other spondylosis with radiculopathy, lumbar region: Secondary | ICD-10-CM | POA: Diagnosis not present

## 2018-05-04 DIAGNOSIS — M4716 Other spondylosis with myelopathy, lumbar region: Secondary | ICD-10-CM | POA: Diagnosis not present

## 2018-05-04 DIAGNOSIS — M48062 Spinal stenosis, lumbar region with neurogenic claudication: Secondary | ICD-10-CM | POA: Diagnosis not present

## 2018-05-04 DIAGNOSIS — M545 Low back pain: Secondary | ICD-10-CM | POA: Diagnosis not present

## 2018-05-04 DIAGNOSIS — Z4689 Encounter for fitting and adjustment of other specified devices: Secondary | ICD-10-CM | POA: Diagnosis not present

## 2018-05-04 DIAGNOSIS — M4726 Other spondylosis with radiculopathy, lumbar region: Secondary | ICD-10-CM | POA: Diagnosis not present

## 2018-05-04 DIAGNOSIS — M5116 Intervertebral disc disorders with radiculopathy, lumbar region: Secondary | ICD-10-CM | POA: Diagnosis not present

## 2018-05-25 ENCOUNTER — Other Ambulatory Visit: Payer: Self-pay | Admitting: Family Medicine

## 2018-06-07 ENCOUNTER — Encounter: Payer: Self-pay | Admitting: Family Medicine

## 2018-06-07 ENCOUNTER — Other Ambulatory Visit: Payer: Self-pay | Admitting: General Practice

## 2018-06-07 MED ORDER — BENAZEPRIL-HYDROCHLOROTHIAZIDE 20-25 MG PO TABS: 2.0000 | ORAL_TABLET | Freq: Every day | ORAL | 3 refills | Status: DC

## 2018-06-14 ENCOUNTER — Other Ambulatory Visit: Payer: Self-pay | Admitting: Family Medicine

## 2018-09-01 ENCOUNTER — Encounter: Payer: Self-pay | Admitting: Family Medicine

## 2018-09-02 ENCOUNTER — Other Ambulatory Visit: Payer: Self-pay | Admitting: *Deleted

## 2018-09-02 MED ORDER — AMLODIPINE BESYLATE 10 MG PO TABS: 10.0000 mg | ORAL_TABLET | Freq: Every day | ORAL | 1 refills | Status: DC

## 2018-09-02 MED ORDER — BENAZEPRIL-HYDROCHLOROTHIAZIDE 20-25 MG PO TABS: 2.0000 | ORAL_TABLET | Freq: Every day | ORAL | 1 refills | Status: DC

## 2018-12-30 ENCOUNTER — Ambulatory Visit (INDEPENDENT_AMBULATORY_CARE_PROVIDER_SITE_OTHER): Payer: BLUE CROSS/BLUE SHIELD | Admitting: Family Medicine

## 2018-12-30 ENCOUNTER — Other Ambulatory Visit: Payer: Self-pay

## 2018-12-30 ENCOUNTER — Encounter: Payer: Self-pay | Admitting: Family Medicine

## 2018-12-30 VITALS — BP 127/89 | HR 101

## 2018-12-30 DIAGNOSIS — I10 Essential (primary) hypertension: Secondary | ICD-10-CM

## 2018-12-31 ENCOUNTER — Other Ambulatory Visit (INDEPENDENT_AMBULATORY_CARE_PROVIDER_SITE_OTHER): Payer: BLUE CROSS/BLUE SHIELD

## 2018-12-31 ENCOUNTER — Encounter: Payer: Self-pay | Admitting: Family Medicine

## 2018-12-31 ENCOUNTER — Other Ambulatory Visit: Payer: Self-pay | Admitting: General Practice

## 2018-12-31 ENCOUNTER — Other Ambulatory Visit: Payer: Self-pay

## 2018-12-31 DIAGNOSIS — I10 Essential (primary) hypertension: Secondary | ICD-10-CM

## 2018-12-31 LAB — BASIC METABOLIC PANEL
BUN: 6 mg/dL (ref 6–23)
CO2: 30 mEq/L (ref 19–32)
Calcium: 10.2 mg/dL (ref 8.4–10.5)
Chloride: 96 mEq/L (ref 96–112)
Creatinine, Ser: 0.77 mg/dL (ref 0.40–1.20)
GFR: 100.71 mL/min (ref >60.00)
Glucose, Bld: 137 mg/dL — ABNORMAL HIGH (ref 70–99)
Potassium: 3.3 mEq/L — ABNORMAL LOW (ref 3.5–5.1)
Sodium: 136 mEq/L (ref 135–145)

## 2018-12-31 LAB — CBC WITH DIFFERENTIAL/PLATELET
Basophils Absolute: 0 10*3/uL (ref 0.0–0.1)
Basophils Relative: 0.6 % (ref 0.0–3.0)
Eosinophils Absolute: 0.1 10*3/uL (ref 0.0–0.7)
Eosinophils Relative: 0.9 % (ref 0.0–5.0)
HCT: 42.6 % (ref 36.0–46.0)
Hemoglobin: 13.7 g/dL (ref 12.0–15.0)
Lymphocytes Relative: 33.3 % (ref 12.0–46.0)
Lymphs Abs: 2.4 10*3/uL (ref 0.7–4.0)
MCHC: 32.2 g/dL (ref 30.0–36.0)
MCV: 85.4 fl (ref 78.0–100.0)
Monocytes Absolute: 0.7 10*3/uL (ref 0.1–1.0)
Monocytes Relative: 9.8 % (ref 3.0–12.0)
Neutro Abs: 4 10*3/uL (ref 1.4–7.7)
Neutrophils Relative %: 55.4 % (ref 43.0–77.0)
Platelets: 331 10*3/uL (ref 150.0–400.0)
RBC: 4.98 Mil/uL (ref 3.87–5.11)
RDW: 15.1 % (ref 11.5–15.5)
WBC: 7.2 10*3/uL (ref 4.0–10.5)

## 2018-12-31 MED ORDER — POTASSIUM CHLORIDE CRYS ER 20 MEQ PO TBCR: 20.0000 meq | EXTENDED_RELEASE_TABLET | Freq: Every day | ORAL | 3 refills | Status: DC

## 2019-01-03 ENCOUNTER — Other Ambulatory Visit: Payer: Self-pay | Admitting: General Practice

## 2019-01-03 ENCOUNTER — Encounter: Payer: Self-pay | Admitting: Family Medicine

## 2019-01-03 ENCOUNTER — Other Ambulatory Visit: Payer: BLUE CROSS/BLUE SHIELD

## 2019-01-03 ENCOUNTER — Other Ambulatory Visit (INDEPENDENT_AMBULATORY_CARE_PROVIDER_SITE_OTHER): Payer: BLUE CROSS/BLUE SHIELD

## 2019-01-03 DIAGNOSIS — R7309 Other abnormal glucose: Secondary | ICD-10-CM

## 2019-01-03 LAB — HEMOGLOBIN A1C: Hgb A1c MFr Bld: 7.5 % — ABNORMAL HIGH (ref 4.6–6.5)

## 2019-01-03 MED ORDER — METFORMIN HCL 500 MG PO TABS: 500.0000 mg | ORAL_TABLET | Freq: Two times a day (BID) | ORAL | 1 refills | Status: DC

## 2019-01-05 ENCOUNTER — Ambulatory Visit (INDEPENDENT_AMBULATORY_CARE_PROVIDER_SITE_OTHER): Payer: BLUE CROSS/BLUE SHIELD | Admitting: Family Medicine

## 2019-01-05 ENCOUNTER — Encounter: Payer: Self-pay | Admitting: Family Medicine

## 2019-01-05 ENCOUNTER — Other Ambulatory Visit: Payer: Self-pay

## 2019-01-05 VITALS — BP 138/80 | HR 138 | Temp 98.1°F | Resp 16 | Ht 67.0 in | Wt 214.5 lb

## 2019-01-05 DIAGNOSIS — Z6833 Body mass index (BMI) 33.0-33.9, adult: Secondary | ICD-10-CM

## 2019-01-05 DIAGNOSIS — E6609 Other obesity due to excess calories: Secondary | ICD-10-CM | POA: Diagnosis not present

## 2019-01-05 DIAGNOSIS — E119 Type 2 diabetes mellitus without complications: Secondary | ICD-10-CM

## 2019-01-05 DIAGNOSIS — L02412 Cutaneous abscess of left axilla: Secondary | ICD-10-CM | POA: Diagnosis not present

## 2019-01-05 MED ORDER — DOXYCYCLINE HYCLATE 100 MG PO TABS: 100.0000 mg | ORAL_TABLET | Freq: Two times a day (BID) | ORAL | 0 refills | Status: DC

## 2019-01-05 NOTE — Progress Notes (Signed)
   Subjective:    Patient ID: Frances Randolph, female    DOB: 22-Nov-1980, 38 y.o.   MRN: [id number]  HPI DM- new dx for pt.  A1C 7.5  Pt is 'devastated' by diagnosis.  Picked up Metformin on Monday.  CBG prior to dinner on Tuesday was 136.  This AM was 154.  Plans to start BID dosing today.  No N/V.  Pt is now committed to 40 carbs or less.  Pt is now walking 30 minutes twice daily.    Abscess- L axilla.  Pt reports a hx of these that have required surgery in the past.  States this appeared 4-5 days ago.  Area is large, TTP.  Not currently draining.   Review of Systems For ROS see HPI     Objective:   Physical Exam Constitutional:      Appearance: Normal appearance. She is obese.  HENT:     Head: Normocephalic and atraumatic.  Skin:    General: Skin is warm and dry.     Comments: L axillary abscess ~4cm x 2 cm w/ medial area of early ulceration.  Pt consented to I&D.  Area prepped w/ alcohol, sprayed w/ cold spray, incised w/ 15 blade.  Copious purulent fluid drained immediately at high pressure.  Using firm pressure, all pus was drained until only bloody drainage remained.  Area was dressed w/ abx ointment and bandaid applied.  Pt tolerated procedure w/o difficulty  Neurological:     General: No focal deficit present.     Mental Status: She is alert and oriented to person, place, and time.  Psychiatric:        Mood and Affect: Mood normal.        Behavior: Behavior normal.        Thought Content: Thought content normal.           Assessment & Plan:  Axillary abscess- new.  Pt consented to I&D.  Tolerated procedure w/o difficulty.  Will start Doxy to treat any remaining infection.  Pt expressed understanding and is in agreement w/ plan.

## 2019-01-05 NOTE — Patient Instructions (Signed)
Follow up in 3 months to recheck sugar START the Doxycycline twice daily- take w/ food Hot compresses to the area will help bring the rest of it to a head Continue to exercise regularly and limit your carbs- you're doing great! Call with any questions or concerns Hang in there!

## 2019-01-06 DIAGNOSIS — E119 Type 2 diabetes mellitus without complications: Secondary | ICD-10-CM | POA: Insufficient documentation

## 2019-01-06 NOTE — Assessment & Plan Note (Signed)
New dx.  Reviewed dx, possible complications, treatment plan w/ pt.  Discussed low carb diet, regular exercise.  Discussed need for yearly eye exam.  Already on ACE for renal protection.  Pt is already limiting the carbs in her diet and her goal is to get off all medication ASAP.  Tolerating metformin w/o difficulty.  Will continue to follow.

## 2019-01-06 NOTE — Assessment & Plan Note (Signed)
Ongoing issue.  The dx of DM has really shaken her and she is now committed to healthy diet and regular exercise.  Will continue to follow.

## 2019-01-08 LAB — WOUND CULTURE
MICRO NUMBER:: 717226
SPECIMEN QUALITY:: ADEQUATE

## 2019-02-24 ENCOUNTER — Other Ambulatory Visit: Payer: Self-pay | Admitting: Family Medicine

## 2019-03-09 ENCOUNTER — Encounter: Payer: Self-pay | Admitting: Family Medicine

## 2019-03-09 NOTE — Progress Notes (Signed)
I have discussed the procedure for the virtual visit with the patient who has given consent to proceed with assessment and treatment.   Jessica L Brodmerkel, CMA     

## 2019-03-09 NOTE — Progress Notes (Signed)
   Virtual Visit via Video   I connected with patient on 03/09/19 at  4:15 PM EDT by a video enabled telemedicine application and verified that I am speaking with the correct person using two identifiers.  Location patient: Home Location provider: Acup[facility]urist, Office Persons participating in the virtual visit: Patient, Provider, [place] (Jess B)  I discussed the limitations of evaluation and management by telemedicine and the availability of in person appointments. The patient expressed understanding and agreed to proceed.  LATE ENTRY DUE TO POWER OUTAGE  Subjective:   HPI:   HTN- chronic problem, pt is on Benazepril HCTZ 20/25mg  BID, Amlodipine 10mg  daily.  She reports that AM BPs and PM BPs remain above goal.  'the medicine gives out'.  AM BP 150/99 and PM BP 141/101.  Reports having HA and feeling 'foggy headed'.  Denies CP, SOB, palpitations.  ROS:   See pertinent positives and negatives per HPI.  Patient Active Problem List   Diagnosis Date Noted  . Diabetes mellitus without complication ([facility]) 72/62/0355  . Chronic left-sided low back pain with left-sided sciatica 12/28/2017  . Hyperlipidemia 10/09/2017  . Pap smear for cervical cancer screening 10/08/2015  . Vitamin D deficiency 04/10/2015  . Eustachian tube dysfunction 03/31/2014  . Vaginal irritation 02/21/2014  . Thyromegaly 03/18/2013  . Ringworm of foot 01/23/2012  . Physical exam, routine 01/23/2012  . IRON DEFICIENCY ANEMIA SECONDARY TO BLOOD LOSS 04/09/2009  . EAR PROBLEM 03/19/2009  . BACK PAIN 03/19/2009  . OBESITY 11/17/2008  . HERPES LABIALIS 01/27/2008  . CHEST PAIN 04/23/2007  . Essential hypertension 01/29/2007    Social History   Tobacco Use  . Smoking status: Never Smoker  . Smokeless tobacco: Never Used  Substance Use Topics  . Alcohol use: No    Current Outpatient Medications:  .  amLODipine (NORVASC) 10 MG tablet, TAKE 1 TABLET BY MOUTH EVERY DAY, Disp: 90 tablet, Rfl: 1 .   benazepril-hydrochlorthiazide (LOTENSIN HCT) 20-25 MG tablet, Take 2 tablets by mouth daily., Disp: 180 tablet, Rfl: 1 .  doxycycline (VIBRA-TABS) 100 MG tablet, Take 1 tablet (100 mg total) by mouth 2 (two) times daily., Disp: 20 tablet, Rfl: 0 .  metFORMIN (GLUCOPHAGE) 500 MG tablet, Take 1 tablet (500 mg total) by mouth 2 (two) times daily with a meal., Disp: 180 tablet, Rfl: 1 .  potassium chloride SA (K-DUR) 20 MEQ tablet, Take 1 tablet (20 mEq total) by mouth daily., Disp: 30 tablet, Rfl: 3  Allergies  Allergen Reactions  . Oxycodone Nausea Only    Objective:   BP 127/89   Pulse (!) 101   AAOx3, NAD NCAT, EOMI No obvious CN deficits Coloring WNL Pt is able to speak clearly, coherently without shortness of breath or increased work of breathing.  Thought process is linear.  Mood is appropriate.   Assessment and Plan:   HTN- deteriorated.  Pt reports AM and PM BP elevations.  Having associated HA and 'fogginess'.  Based on this, will start Metoprolol 25mg  in addition to current medications.  Pt expressed understanding and is in agreement w/ plan.    Annye Asa, MD 03/09/2019

## 2019-03-23 ENCOUNTER — Other Ambulatory Visit: Payer: Self-pay | Admitting: Family Medicine

## 2019-04-07 ENCOUNTER — Ambulatory Visit: Payer: BLUE CROSS/BLUE SHIELD | Admitting: Family Medicine

## 2019-04-07 ENCOUNTER — Other Ambulatory Visit: Payer: Self-pay

## 2019-04-13 ENCOUNTER — Encounter: Payer: Self-pay | Admitting: Family Medicine

## 2019-04-14 ENCOUNTER — Encounter: Payer: Self-pay | Admitting: General Practice

## 2019-04-14 ENCOUNTER — Other Ambulatory Visit: Payer: Self-pay | Admitting: General Practice

## 2019-04-14 DIAGNOSIS — E785 Hyperlipidemia, unspecified: Secondary | ICD-10-CM

## 2019-04-14 MED ORDER — ATORVASTATIN CALCIUM 20 MG PO TABS: 20.0000 mg | ORAL_TABLET | Freq: Every day | ORAL | 6 refills | Status: DC

## 2019-04-14 MED ORDER — POTASSIUM CHLORIDE CRYS ER 20 MEQ PO TBCR: 20.0000 meq | EXTENDED_RELEASE_TABLET | Freq: Every day | ORAL | 3 refills | Status: DC

## 2019-04-29 LAB — HM PAP SMEAR

## 2019-06-08 ENCOUNTER — Encounter: Payer: Self-pay | Admitting: Family Medicine

## 2019-06-08 MED ORDER — AMLODIPINE BESYLATE 10 MG PO TABS: 10.0000 mg | ORAL_TABLET | Freq: Every day | ORAL | 1 refills | Status: DC

## 2019-06-08 MED ORDER — METFORMIN HCL 500 MG PO TABS: 500.0000 mg | ORAL_TABLET | Freq: Two times a day (BID) | ORAL | 1 refills | Status: DC

## 2019-06-08 MED ORDER — BENAZEPRIL-HYDROCHLOROTHIAZIDE 20-25 MG PO TABS: 2.0000 | ORAL_TABLET | Freq: Every day | ORAL | 1 refills | Status: DC

## 2019-06-16 ENCOUNTER — Ambulatory Visit: Payer: BLUE CROSS/BLUE SHIELD | Admitting: Family Medicine

## 2019-07-07 ENCOUNTER — Other Ambulatory Visit: Payer: Self-pay | Admitting: Family Medicine

## 2019-07-10 IMAGING — DX DG LUMBAR SPINE 2-3V
3 series · 3 of 3 positions shown · non-contrast
Comparison: None.

CLINICAL DATA: 37-year-old female with chronic low back pain and
left-sided sciatica for 4 months. No known injury. Initial
encounter.

EXAM:
LUMBAR SPINE - 2-3 VIEW

[lumbar spine ap]
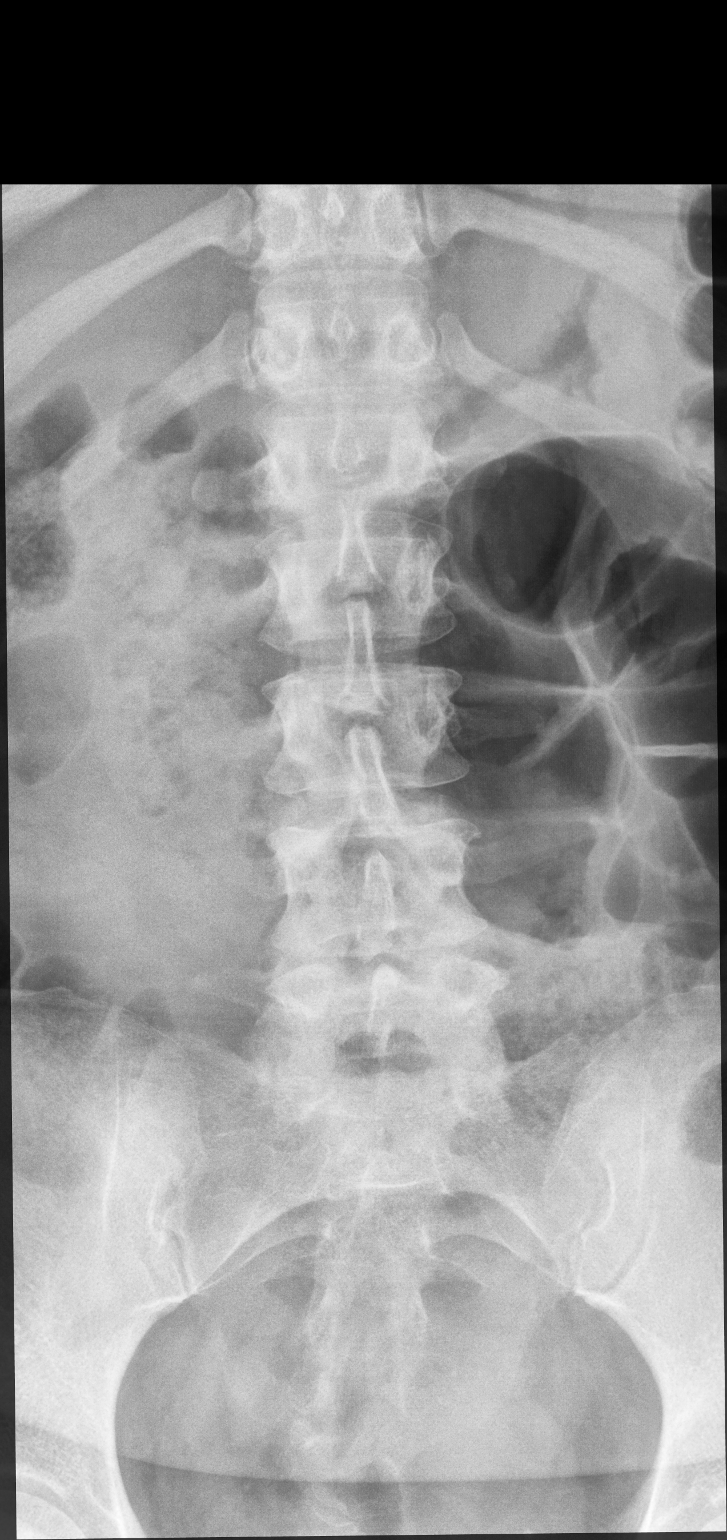

[lumbar spine lat (1 of 2)]
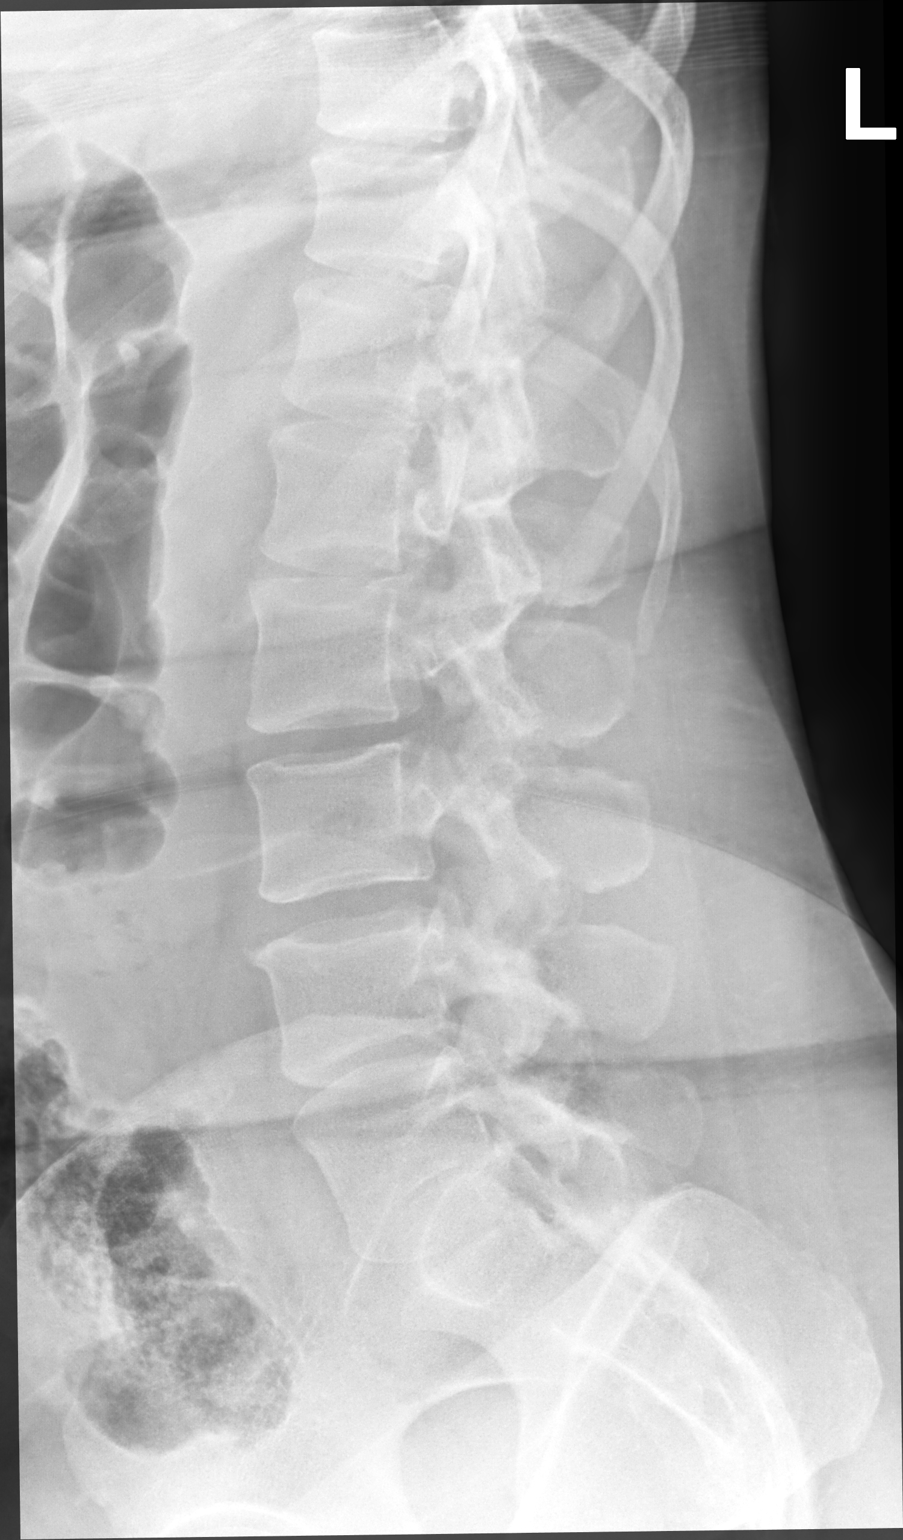

[lumbar spine lat (2 of 2)]
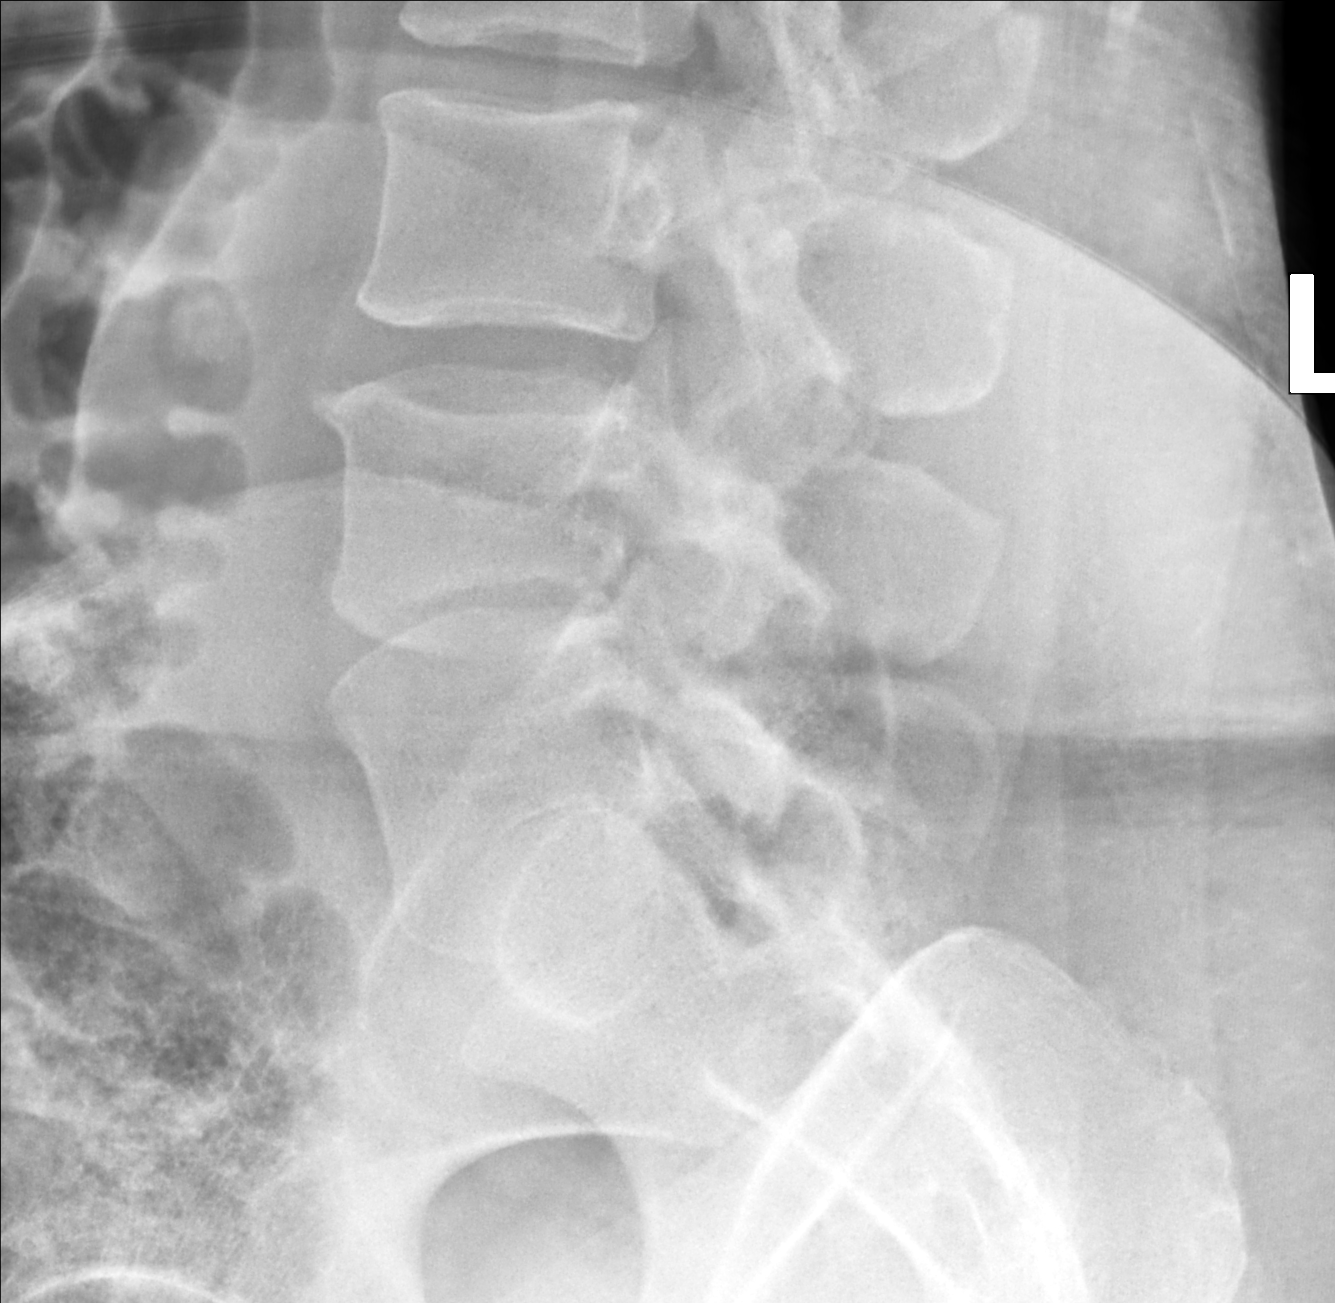

[3 of 3 positions shown; findings below may reference images not displayed]

FINDINGS: Slightly oblique lateral view with findings suggesting mild L4-5
disc space narrowing.

No compression fracture.

Minimal sclerosis superior aspect right sacroiliac joint.
IMPRESSION: Slightly oblique lateral view with findings suggesting mild L4-5
disc space narrowing.

## 2019-09-02 ENCOUNTER — Other Ambulatory Visit: Payer: Self-pay | Admitting: Family Medicine

## 2019-09-03 ENCOUNTER — Other Ambulatory Visit: Payer: Self-pay | Admitting: Family Medicine

## 2019-09-20 ENCOUNTER — Other Ambulatory Visit: Payer: Self-pay | Admitting: Family Medicine

## 2019-10-02 ENCOUNTER — Other Ambulatory Visit: Payer: Self-pay | Admitting: Family Medicine

## 2019-10-17 ENCOUNTER — Other Ambulatory Visit: Payer: Self-pay | Admitting: Family Medicine

## 2019-11-21 ENCOUNTER — Telehealth (INDEPENDENT_AMBULATORY_CARE_PROVIDER_SITE_OTHER): Payer: 59 | Admitting: Family Medicine

## 2019-11-21 ENCOUNTER — Encounter: Payer: Self-pay | Admitting: Family Medicine

## 2019-11-21 ENCOUNTER — Other Ambulatory Visit: Payer: Self-pay

## 2019-11-21 VITALS — BP 138/90 | HR 77 | Ht 67.0 in | Wt 218.0 lb

## 2019-11-21 DIAGNOSIS — I10 Essential (primary) hypertension: Secondary | ICD-10-CM

## 2019-11-21 DIAGNOSIS — E119 Type 2 diabetes mellitus without complications: Secondary | ICD-10-CM

## 2019-11-21 DIAGNOSIS — Z6834 Body mass index (BMI) 34.0-34.9, adult: Secondary | ICD-10-CM | POA: Diagnosis not present

## 2019-11-21 DIAGNOSIS — E6609 Other obesity due to excess calories: Secondary | ICD-10-CM

## 2019-11-21 MED ORDER — METOPROLOL SUCCINATE ER 25 MG PO TB24: 25.0000 mg | ORAL_TABLET | Freq: Every day | ORAL | 1 refills | Status: DC

## 2019-11-21 NOTE — Progress Notes (Signed)
Virtual Visit via Video   I connected with patient on 11/21/19 at  4:00 PM EDT by a video enabled telemedicine application and verified that I am speaking with the correct person using two identifiers.  Location patient: Home Location provider: Astronomer, Office Persons participating in the virtual visit: Patient, Provider, CMA (Jess B)  I discussed the limitations of evaluation and management by telemedicine and the availability of in person appointments. The patient expressed understanding and agreed to proceed.  Subjective:   HPI:   HTN- chronic problem, on Amlodipine 10mg  daily, Benazepril HCTZ 20/25mg  twice daily, and Coreg 6.25mg  BID.  Prior to Coreg she was on Metoprolol XL 25mg  daily.  She went to Cardiologist for Bariatric clearance and HR was >100 and BP was 150/88.  Pt has hx of white coat HTN.  Pt's home cuff has matched w/ office cuff in the past.  Was told she could have HAs and dizziness w/ change in medication.  Since switching medication she developed leg swelling w/ prolonged standing.    DM- ongoing issue for pt.  She has not followed up as instructed.  On Metformin 500mg  twice daily.  A1C on 04/13/19 ws 5.4  On ACE for renal protection.  Due for foot exam and eye exam.  Obesity- Pt has been seeing Bariatrics in hopes of having weight loss surgery.  Pt was up to 226 after COVID quarantine, Depo shot.  ROS:   See pertinent positives and negatives per HPI.  Patient Active Problem List   Diagnosis Date Noted   Diabetes mellitus without complication ([facility]) 01/06/2019   Chronic left-sided low back pain with left-sided sciatica 12/28/2017   Hyperlipidemia 10/09/2017   Pap smear for cervical cancer screening 10/08/2015   Vitamin D deficiency 04/10/2015   Eustachian tube dysfunction 03/31/2014   Vaginal irritation 02/21/2014   Thyromegaly 03/18/2013   Ringworm of foot 01/23/2012   Physical exam, routine 01/23/2012   IRON DEFICIENCY ANEMIA  SECONDARY TO BLOOD LOSS 04/09/2009   EAR PROBLEM 03/19/2009   BACK PAIN 03/19/2009   OBESITY 11/17/2008   HERPES LABIALIS 01/27/2008   CHEST PAIN 04/23/2007   Essential hypertension 01/29/2007    Social History   Tobacco Use   Smoking status: Never Smoker   Smokeless tobacco: Never Used  Substance Use Topics   Alcohol use: No    Current Outpatient Medications:    amLODipine (NORVASC) 10 MG tablet, TAKE 1 TABLET BY MOUTH EVERY DAY, Disp: 90 tablet, Rfl: 1   atorvastatin (LIPITOR) 20 MG tablet, Take 1 tablet (20 mg total) by mouth daily., Disp: 30 tablet, Rfl: 6   benazepril-hydrochlorthiazide (LOTENSIN HCT) 20-25 MG tablet, Take 2 tablets by mouth daily. Please call the office to schedule a blood pressure follow up, Disp: 60 tablet, Rfl: 0   carvedilol (COREG) 6.25 MG tablet, Take 6.25 mg by mouth 2 (two) times daily., Disp: , Rfl:    KLOR-CON M20 20 MEQ tablet, TAKE 1 TABLET BY MOUTH EVERY DAY, Disp: 90 tablet, Rfl: 1   metFORMIN (GLUCOPHAGE) 500 MG tablet, Take 1 tablet (500 mg total) by mouth 2 (two) times daily with a meal., Disp: 180 tablet, Rfl: 1  Allergies  Allergen Reactions   Oxycodone Nausea Only    Objective:   BP 138/90    Pulse 77    Ht 5\' 7"  (1.702 m)    Wt 218 lb (98.9 kg)    BMI 34.14 kg/m  AAOx3, NAD, obese NCAT, EOMI No obvious CN deficits Coloring WNL  Pt is able to speak clearly, coherently without shortness of breath or increased work of breathing.  Thought process is linear.  Mood is appropriate.   Assessment and Plan:   HTN- chronic problem.  Pt recently had beta blocker switched from Metoprolol XL to Coreg BID.  Since the switch, she reports HAs, dizziness, and leg swelling.  She wants to switch back to Metoprolol once daily.  She was advised to start that tonight and stop the Coreg.  She will come in for labs and BP check.  DM- ongoing issue.  Last A1C in November was 5.4  She has not followed up as directed.  Encouraged her to  schedule eye exam.  Due for foot exam.  Will get labs and adjust meds prn.  Obesity- Pt reports she got up to 226.  Is now 218.  Is considering bariatric surgery but is right on BMI cusp.  Encouraged her to work on healthy, low carb diet and regular exercise.  Will follow along.  Annye Asa, MD 11/21/2019

## 2019-11-21 NOTE — Progress Notes (Signed)
I have discussed the procedure for the virtual visit with the patient who has given consent to proceed with assessment and treatment.   Pt was getting weight loss surgery clearance from Cardiologist. BP was 150/88. Changed her to Coreg 6.25mg  BID on 10/21/19. Advised she could experience some HA and dizziness.   Pt experienced severe lower leg edema on 11/13/19.   Geannie Risen, CMA

## 2019-11-30 ENCOUNTER — Other Ambulatory Visit: Payer: Self-pay

## 2019-11-30 ENCOUNTER — Ambulatory Visit (INDEPENDENT_AMBULATORY_CARE_PROVIDER_SITE_OTHER): Payer: 59

## 2019-11-30 ENCOUNTER — Ambulatory Visit: Payer: 59 | Admitting: Family Medicine

## 2019-11-30 DIAGNOSIS — I10 Essential (primary) hypertension: Secondary | ICD-10-CM

## 2019-11-30 DIAGNOSIS — E119 Type 2 diabetes mellitus without complications: Secondary | ICD-10-CM | POA: Diagnosis not present

## 2019-11-30 LAB — CBC WITH DIFFERENTIAL/PLATELET
Basophils Absolute: 0.1 10*3/uL (ref 0.0–0.1)
Basophils Relative: 1.2 % (ref 0.0–3.0)
Eosinophils Absolute: 0.1 10*3/uL (ref 0.0–0.7)
Eosinophils Relative: 1.5 % (ref 0.0–5.0)
HCT: 36.4 % (ref 36.0–46.0)
Hemoglobin: 11.7 g/dL — ABNORMAL LOW (ref 12.0–15.0)
Lymphocytes Relative: 34.9 % (ref 12.0–46.0)
Lymphs Abs: 2.4 10*3/uL (ref 0.7–4.0)
MCHC: 32.2 g/dL (ref 30.0–36.0)
MCV: 72 fl — ABNORMAL LOW (ref 78.0–100.0)
Monocytes Absolute: 0.6 10*3/uL (ref 0.1–1.0)
Monocytes Relative: 9.1 % (ref 3.0–12.0)
Neutro Abs: 3.7 10*3/uL (ref 1.4–7.7)
Neutrophils Relative %: 53.3 % (ref 43.0–77.0)
Platelets: 468 10*3/uL — ABNORMAL HIGH (ref 150.0–400.0)
RBC: 5.06 Mil/uL (ref 3.87–5.11)
RDW: 20.1 % — ABNORMAL HIGH (ref 11.5–15.5)
WBC: 6.9 10*3/uL (ref 4.0–10.5)

## 2019-11-30 LAB — LIPID PANEL
Cholesterol: 184 mg/dL (ref 0–200)
HDL: 40.1 mg/dL (ref >39.00)
NonHDL: 144.17
Total CHOL/HDL Ratio: 5
Triglycerides: 224 mg/dL — ABNORMAL HIGH (ref 0.0–149.0)
VLDL: 44.8 mg/dL — ABNORMAL HIGH (ref 0.0–40.0)

## 2019-11-30 LAB — HEMOGLOBIN A1C: Hgb A1c MFr Bld: 6.3 % (ref 4.6–6.5)

## 2019-11-30 LAB — HEPATIC FUNCTION PANEL
ALT: 17 U/L (ref 0–35)
AST: 17 U/L (ref 0–37)
Albumin: 4.4 g/dL (ref 3.5–5.2)
Alkaline Phosphatase: 83 U/L (ref 39–117)
Bilirubin, Direct: 0.1 mg/dL (ref 0.0–0.3)
Total Bilirubin: 0.3 mg/dL (ref 0.2–1.2)
Total Protein: 7.2 g/dL (ref 6.0–8.3)

## 2019-11-30 LAB — BASIC METABOLIC PANEL
BUN: 9 mg/dL (ref 6–23)
CO2: 25 mEq/L (ref 19–32)
Calcium: 9.7 mg/dL (ref 8.4–10.5)
Chloride: 100 mEq/L (ref 96–112)
Creatinine, Ser: 0.74 mg/dL (ref 0.40–1.20)
GFR: 104.95 mL/min (ref >60.00)
Glucose, Bld: 104 mg/dL — ABNORMAL HIGH (ref 70–99)
Potassium: 3.8 mEq/L (ref 3.5–5.1)
Sodium: 135 mEq/L (ref 135–145)

## 2019-11-30 LAB — LDL CHOLESTEROL, DIRECT: Direct LDL: 114 mg/dL

## 2019-11-30 LAB — TSH: TSH: 4.37 u[IU]/mL (ref 0.35–4.50)

## 2019-11-30 NOTE — Progress Notes (Addendum)
Frances Randolph, 39 year old female presents to the office today for lab work and a blood pressure check. Her blood pressure was 138/82 in the right arm.  Noted.  No changes to medications at this time.  Neena Rhymes, MD

## 2019-12-01 NOTE — Addendum Note (Signed)
Addended by: Sheliah Hatch on: 12/01/2019 11:31 AM   Modules accepted: Level of Service

## 2020-01-21 ENCOUNTER — Other Ambulatory Visit: Payer: Self-pay | Admitting: Family Medicine

## 2020-05-25 ENCOUNTER — Other Ambulatory Visit: Payer: Self-pay | Admitting: Family Medicine

## 2020-06-16 ENCOUNTER — Other Ambulatory Visit: Payer: Self-pay | Admitting: Family Medicine

## 2020-07-14 ENCOUNTER — Other Ambulatory Visit: Payer: Self-pay | Admitting: Family Medicine

## 2020-07-17 ENCOUNTER — Other Ambulatory Visit: Payer: Self-pay

## 2020-07-17 ENCOUNTER — Telehealth: Payer: Self-pay

## 2020-07-17 DIAGNOSIS — E785 Hyperlipidemia, unspecified: Secondary | ICD-10-CM

## 2020-07-17 MED ORDER — ATORVASTATIN CALCIUM 20 MG PO TABS: 20.0000 mg | ORAL_TABLET | Freq: Every day | ORAL | 0 refills | Status: DC

## 2020-07-17 NOTE — Telephone Encounter (Signed)
Called to inform patient that her atorvastatin 20mg  has not been filled in a while. Patient stated that her numbers looked good so she has not been taking it but she has not had an appt neither. So I refilled Rx for 30 days and pt will call office back and schedule an appt

## 2020-07-18 NOTE — Telephone Encounter (Signed)
Patient will call office and schedule a follow up appt with Dr.Tabori

## 2020-07-31 ENCOUNTER — Other Ambulatory Visit: Payer: Self-pay | Admitting: Family Medicine

## 2020-07-31 DIAGNOSIS — E785 Hyperlipidemia, unspecified: Secondary | ICD-10-CM

## 2020-08-10 ENCOUNTER — Other Ambulatory Visit: Payer: Self-pay | Admitting: Family Medicine

## 2020-09-11 ENCOUNTER — Other Ambulatory Visit: Payer: Self-pay | Admitting: Family Medicine

## 2020-09-26 ENCOUNTER — Other Ambulatory Visit: Payer: Self-pay | Admitting: Family Medicine

## 2020-10-30 ENCOUNTER — Other Ambulatory Visit: Payer: Self-pay | Admitting: Family Medicine

## 2020-11-09 ENCOUNTER — Other Ambulatory Visit: Payer: Self-pay

## 2020-11-09 DIAGNOSIS — E785 Hyperlipidemia, unspecified: Secondary | ICD-10-CM

## 2020-11-09 MED ORDER — ATORVASTATIN CALCIUM 20 MG PO TABS: 1.0000 | ORAL_TABLET | Freq: Every day | ORAL | 0 refills | Status: DC

## 2020-12-05 ENCOUNTER — Encounter: Payer: Self-pay | Admitting: *Deleted

## 2021-01-29 ENCOUNTER — Other Ambulatory Visit: Payer: Self-pay | Admitting: Family Medicine

## 2021-04-08 ENCOUNTER — Ambulatory Visit (INDEPENDENT_AMBULATORY_CARE_PROVIDER_SITE_OTHER): Payer: 59 | Admitting: Family Medicine

## 2021-04-08 ENCOUNTER — Encounter: Payer: Self-pay | Admitting: Family Medicine

## 2021-04-08 ENCOUNTER — Other Ambulatory Visit: Payer: Self-pay

## 2021-04-08 VITALS — BP 122/82 | HR 126 | Temp 97.3°F | Resp 16 | Wt 207.4 lb

## 2021-04-08 DIAGNOSIS — E669 Obesity, unspecified: Secondary | ICD-10-CM | POA: Diagnosis not present

## 2021-04-08 DIAGNOSIS — E119 Type 2 diabetes mellitus without complications: Secondary | ICD-10-CM | POA: Diagnosis not present

## 2021-04-08 DIAGNOSIS — E785 Hyperlipidemia, unspecified: Secondary | ICD-10-CM | POA: Diagnosis not present

## 2021-04-08 DIAGNOSIS — I10 Essential (primary) hypertension: Secondary | ICD-10-CM | POA: Diagnosis not present

## 2021-04-08 MED ORDER — OMEPRAZOLE 10 MG PO CPDR: 40.0000 mg | DELAYED_RELEASE_CAPSULE | Freq: Every day | ORAL | 0 refills | Status: DC

## 2021-04-08 MED ORDER — POTASSIUM CHLORIDE CRYS ER 20 MEQ PO TBCR: 20.0000 meq | EXTENDED_RELEASE_TABLET | Freq: Every day | ORAL | 0 refills | Status: DC

## 2021-04-08 NOTE — Progress Notes (Signed)
   Subjective:    Patient ID: Frances Randolph, female    DOB: [birth year], 40 y.o.   MRN: [id number]  HPI HTN- chronic problem, on Amlodipine 10mg  daily, Benazepril HCTZ 20/25mg  w/ good control.  Not taking her Metoprolol.  Denies CP, SOB, HAs, visual changes, edema.  Hyperlipidemia- was prescribed Lipitor but not taking.  Denies abd pain, N/V.  DM- chronic problem, on Metformin 500mg  BID.  On Ozempic weekly.  On ACE for renal protection.  Denies symptomatic lows.  No numbness/tingling of hands/feet.  Foot exam due today.  Due for eye exam.  Obesity- pt is down 11 lbs since last visit.  She actually got up to 226 lbs and was going to have gastric bypass.  Now on Ozempic weekly   Review of Systems For ROS see HPI   This visit occurred during the SARS-CoV-2 public health emergency.  Safety protocols were in place, including screening questions prior to the visit, additional usage of staff PPE, and extensive cleaning of exam room while observing appropriate contact time as indicated for disinfecting solutions.      Objective:   Physical Exam Vitals reviewed.  Constitutional:      General: She is not in acute distress.    Appearance: Normal appearance. She is well-developed. She is not ill-appearing.  HENT:     Head: Normocephalic and atraumatic.  Eyes:     Conjunctiva/sclera: Conjunctivae normal.     Pupils: Pupils are equal, round, and reactive to light.  Neck:     Thyroid: Thyromegaly present.  Cardiovascular:     Rate and Rhythm: Normal rate and regular rhythm.     Heart sounds: Normal heart sounds. No murmur heard. Pulmonary:     Effort: Pulmonary effort is normal. No respiratory distress.     Breath sounds: Normal breath sounds.  Abdominal:     General: There is no distension.     Palpations: Abdomen is soft.     Tenderness: There is no abdominal tenderness.  Musculoskeletal:     Cervical back: Normal range of motion and neck supple.  Lymphadenopathy:     Cervical: No  cervical adenopathy.  Skin:    General: Skin is warm and dry.  Neurological:     Mental Status: She is alert and oriented to person, place, and time.  Psychiatric:        Behavior: Behavior normal.          Assessment & Plan:

## 2021-04-08 NOTE — Assessment & Plan Note (Signed)
Chronic problem.  Overdue for f/u.  Now on Ozempic in addition to her Metformin.  On ACE for renal protection.  Foot exam done today.  Due for eye exam- referral placed.  Check labs.  Adjust meds prn

## 2021-04-08 NOTE — Assessment & Plan Note (Signed)
Chronic problem.  Was previously prescribed Lipitor but not taking.  Will check labs and restart meds if needed.  Pt expressed understanding and is in agreement w/ plan.

## 2021-04-08 NOTE — Assessment & Plan Note (Signed)
Pt is down 11 lbs and BMI is now 32.48  Applauded her efforts and commitment.  Will continue to follow.

## 2021-04-08 NOTE — Patient Instructions (Addendum)
Follow up in 3-4 months to recheck DM Schedule a lab visit at your convenience Schedule your eye exam!!! Continue to work on healthy diet and regular exercise- you're doing great!!! Call with any questions or concerns Stay Safe!  Stay Healthy! SO PROUD OF YOU!!!

## 2021-04-08 NOTE — Assessment & Plan Note (Signed)
Chronic problem.  Currently well controlled on Amlodipine, Benazepril HCTZ.  Asymptomatic.  Check labs due to ACE and diuretic.  No anticipated med changes.  Will follow.

## 2021-04-09 ENCOUNTER — Encounter: Payer: Self-pay | Admitting: Family Medicine

## 2021-04-09 ENCOUNTER — Other Ambulatory Visit: Payer: Self-pay | Admitting: Family Medicine

## 2021-04-15 ENCOUNTER — Other Ambulatory Visit: Payer: 59

## 2021-05-03 ENCOUNTER — Other Ambulatory Visit: Payer: Self-pay | Admitting: Family Medicine

## 2021-05-05 ENCOUNTER — Other Ambulatory Visit: Payer: Self-pay | Admitting: Family Medicine

## 2021-07-07 ENCOUNTER — Other Ambulatory Visit: Payer: Self-pay | Admitting: Family Medicine

## 2021-08-14 ENCOUNTER — Other Ambulatory Visit: Payer: Self-pay | Admitting: Family Medicine

## 2021-08-16 ENCOUNTER — Ambulatory Visit: Payer: 59 | Admitting: Family Medicine

## 2021-09-27 ENCOUNTER — Ambulatory Visit: Payer: 59 | Admitting: Family Medicine

## 2021-10-16 ENCOUNTER — Other Ambulatory Visit: Payer: Self-pay | Admitting: Family Medicine

## 2021-10-30 ENCOUNTER — Ambulatory Visit: Payer: 59 | Admitting: Family Medicine

## 2021-11-25 ENCOUNTER — Ambulatory Visit: Payer: 59 | Admitting: Family Medicine

## 2022-01-20 ENCOUNTER — Ambulatory Visit: Payer: 59 | Admitting: Family Medicine

## 2022-02-03 ENCOUNTER — Ambulatory Visit: Payer: BLUE CROSS/BLUE SHIELD | Admitting: Family Medicine

## 2022-03-13 ENCOUNTER — Other Ambulatory Visit: Payer: Self-pay | Admitting: Family Medicine

## 2022-04-17 ENCOUNTER — Other Ambulatory Visit: Payer: Self-pay | Admitting: Family Medicine

## 2022-05-26 ENCOUNTER — Other Ambulatory Visit: Payer: Self-pay | Admitting: Family Medicine

## 2022-07-05 ENCOUNTER — Other Ambulatory Visit: Payer: Self-pay | Admitting: Family Medicine

## 2022-08-05 ENCOUNTER — Other Ambulatory Visit: Payer: Self-pay | Admitting: Family Medicine

## 2022-08-09 ENCOUNTER — Other Ambulatory Visit: Payer: Self-pay | Admitting: Family Medicine

## 2022-08-21 DIAGNOSIS — Z1330 Encounter for screening examination for mental health and behavioral disorders, unspecified: Secondary | ICD-10-CM | POA: Diagnosis not present

## 2022-08-21 DIAGNOSIS — Z8639 Personal history of other endocrine, nutritional and metabolic disease: Secondary | ICD-10-CM | POA: Diagnosis not present

## 2022-08-21 DIAGNOSIS — N939 Abnormal uterine and vaginal bleeding, unspecified: Secondary | ICD-10-CM | POA: Diagnosis not present

## 2022-09-12 ENCOUNTER — Telehealth: Payer: Self-pay

## 2022-09-12 ENCOUNTER — Ambulatory Visit: Payer: BC Managed Care – PPO | Admitting: Family Medicine

## 2022-09-12 VITALS — BP 126/86 | HR 104 | Temp 98.3°F | Resp 17 | Ht 67.0 in | Wt 180.4 lb

## 2022-09-12 DIAGNOSIS — E669 Obesity, unspecified: Secondary | ICD-10-CM

## 2022-09-12 DIAGNOSIS — E119 Type 2 diabetes mellitus without complications: Secondary | ICD-10-CM

## 2022-09-12 DIAGNOSIS — E559 Vitamin D deficiency, unspecified: Secondary | ICD-10-CM

## 2022-09-12 DIAGNOSIS — I10 Essential (primary) hypertension: Secondary | ICD-10-CM | POA: Diagnosis not present

## 2022-09-12 LAB — BASIC METABOLIC PANEL
BUN: 6 mg/dL (ref 6–23)
CO2: 25 mEq/L (ref 19–32)
Calcium: 9 mg/dL (ref 8.4–10.5)
Chloride: 107 mEq/L (ref 96–112)
Creatinine, Ser: 0.79 mg/dL (ref 0.40–1.20)
GFR: 91.75 mL/min (ref >60.00)
Glucose, Bld: 74 mg/dL (ref 70–99)
Potassium: 3.8 mEq/L (ref 3.5–5.1)
Sodium: 138 mEq/L (ref 135–145)

## 2022-09-12 LAB — LIPID PANEL
Cholesterol: 168 mg/dL (ref 0–200)
HDL: 38.3 mg/dL — ABNORMAL LOW (ref >39.00)
LDL Cholesterol: 111 mg/dL — ABNORMAL HIGH (ref 0–99)
NonHDL: 129.85
Total CHOL/HDL Ratio: 4
Triglycerides: 96 mg/dL (ref 0.0–149.0)
VLDL: 19.2 mg/dL (ref 0.0–40.0)

## 2022-09-12 LAB — VITAMIN D 25 HYDROXY (VIT D DEFICIENCY, FRACTURES): VITD: 19.08 ng/mL — ABNORMAL LOW (ref 30.00–100.00)

## 2022-09-12 LAB — CBC WITH DIFFERENTIAL/PLATELET
Basophils Absolute: 0.1 10*3/uL (ref 0.0–0.1)
Basophils Relative: 1.1 % (ref 0.0–3.0)
Eosinophils Absolute: 0 10*3/uL (ref 0.0–0.7)
Eosinophils Relative: 0.3 % (ref 0.0–5.0)
HCT: 24.9 % — ABNORMAL LOW (ref 36.0–46.0)
Hemoglobin: 8 g/dL — CL (ref 12.0–15.0)
Lymphocytes Relative: 28.1 % (ref 12.0–46.0)
Lymphs Abs: 2.1 10*3/uL (ref 0.7–4.0)
MCHC: 32.1 g/dL (ref 30.0–36.0)
MCV: 84.7 fl (ref 78.0–100.0)
Monocytes Absolute: 0.6 10*3/uL (ref 0.1–1.0)
Monocytes Relative: 8.6 % (ref 3.0–12.0)
Neutro Abs: 4.6 10*3/uL (ref 1.4–7.7)
Neutrophils Relative %: 61.9 % (ref 43.0–77.0)
Platelets: 482 10*3/uL — ABNORMAL HIGH (ref 150.0–400.0)
RBC: 2.94 Mil/uL — ABNORMAL LOW (ref 3.87–5.11)
RDW: 13.9 % (ref 11.5–15.5)
WBC: 7.3 10*3/uL (ref 4.0–10.5)

## 2022-09-12 LAB — TSH: TSH: 1.84 u[IU]/mL (ref 0.35–5.50)

## 2022-09-12 LAB — HEPATIC FUNCTION PANEL
ALT: 15 U/L (ref 0–35)
AST: 17 U/L (ref 0–37)
Albumin: 4.2 g/dL (ref 3.5–5.2)
Alkaline Phosphatase: 61 U/L (ref 39–117)
Bilirubin, Direct: 0.1 mg/dL (ref 0.0–0.3)
Total Bilirubin: 0.3 mg/dL (ref 0.2–1.2)
Total Protein: 6.8 g/dL (ref 6.0–8.3)

## 2022-09-12 LAB — HEMOGLOBIN A1C: Hgb A1c MFr Bld: 4.5 % — ABNORMAL LOW (ref 4.6–6.5)

## 2022-09-12 MED ORDER — BENAZEPRIL-HYDROCHLOROTHIAZIDE 20-25 MG PO TABS: 1.0000 | ORAL_TABLET | Freq: Every day | ORAL | 1 refills | Status: DC

## 2022-09-12 NOTE — Patient Instructions (Signed)
Follow up in 3-4 months to recheck sugar We'll notify you of your lab results and make any changes if needed Keep up the good work on healthy diet and regular exercise- you look great! Drop down to 1 Benazepril HCTZ daily When it's time to renew [facility], just send [place] a message Call with any questions or concerns Happy [place]!!

## 2022-09-12 NOTE — Telephone Encounter (Signed)
Critical lab A1c 8.0

## 2022-09-12 NOTE — Telephone Encounter (Signed)
Noted.  Will address when complete lab results process.

## 2022-09-12 NOTE — Assessment & Plan Note (Signed)
Chronic problem.  Pt has been following w/ [facility] and has not been seen here in ~18 months for her diabetes.  Currently on Mounjaro 12.5mg  weekly.  UTD on eye exam- will get records.  Foot exam done.  Microalbumin ordered.  Check labs.  Adjust meds prn

## 2022-09-12 NOTE — Assessment & Plan Note (Signed)
Pt is down 30 lbs since last visit and BMI is now 28.25- meaning she is no longer obese.  Applauded her efforts at diet and exercise.  Will continue to follow.

## 2022-09-12 NOTE — Progress Notes (Signed)
   Subjective:    Patient ID: Frances Randolph, female    DOB: December 19, 1980, 42 y.o.   MRN: [id number]  HPI DM-chronic problem.  Has been following w/ Bariatric Medicine and has not been seen here since Oct 2022.  She has stopped her Metformin.  Currently on Mounjaro 12.5mg  weekly.  Reports she is UTD on eye exam.  Due for microalbumin and foot exam.  No numbness/tingling of hands/feet.  No numbness/tingling of hands/feet.  CBGs running 70-100s.  HTN- as pt has been losing weight, BP has been normalizing.  Stopped Amlodipine and is taking 1 tab of Benazepril HCTZ 20/25mg  daily.  No CP, SOB, HA's, visual changes, edema.  Obesity- pt is down 27 lbs since 10/22.  Review of Systems For ROS see HPI     Objective:   Physical Exam Vitals reviewed.  Constitutional:      General: She is not in acute distress.    Appearance: Normal appearance. She is well-developed. She is not ill-appearing.  HENT:     Head: Normocephalic and atraumatic.  Eyes:     Conjunctiva/sclera: Conjunctivae normal.     Pupils: Pupils are equal, round, and reactive to light.  Neck:     Thyroid: No thyromegaly.  Cardiovascular:     Rate and Rhythm: Normal rate and regular rhythm.     Pulses: Normal pulses.     Heart sounds: Normal heart sounds. No murmur heard. Pulmonary:     Effort: Pulmonary effort is normal. No respiratory distress.     Breath sounds: Normal breath sounds.  Abdominal:     General: There is no distension.     Palpations: Abdomen is soft.     Tenderness: There is no abdominal tenderness.  Musculoskeletal:     Cervical back: Normal range of motion and neck supple.     Right lower leg: No edema.     Left lower leg: No edema.  Lymphadenopathy:     Cervical: No cervical adenopathy.  Skin:    General: Skin is warm and dry.  Neurological:     Mental Status: She is alert and oriented to person, place, and time.  Psychiatric:        Behavior: Behavior normal.           Assessment & Plan:

## 2022-09-12 NOTE — Assessment & Plan Note (Signed)
Chronic problem.  BP has been better controlled since she lost weight.  Was able to drop the Amlodipine altogether and has been taking 1 Benazepril HCTZ as needed.  Encouraged her to take the 1 tab daily as this protects her kidneys in the setting of diabetes.  Pt expressed understanding and is in agreement w/ plan.

## 2022-09-13 ENCOUNTER — Encounter: Payer: Self-pay | Admitting: Family Medicine

## 2022-09-15 ENCOUNTER — Telehealth: Payer: Self-pay | Admitting: Family Medicine

## 2022-09-15 MED ORDER — VITAMIN D (ERGOCALCIFEROL) 1.25 MG (50000 UNIT) PO CAPS
50000.0000 [IU] | ORAL_CAPSULE | ORAL | 0 refills | Status: DC
Start: 2022-09-15 — End: 2023-04-10

## 2022-09-15 NOTE — Telephone Encounter (Signed)
Patient also sent message asking for an iron infusion today? Please advise this was also the critical lab from Friday

## 2022-09-15 NOTE — Telephone Encounter (Signed)
Spoke to the pt and she states she Is not SOB she is just very fatigued she is currently waiting on OB/GYN to return her call .  I advised her to go to the ER If she does develop any Shortness of breath , soaks a pad every hour  She wants to do know Dr Beverely Low thoughts

## 2022-09-15 NOTE — Telephone Encounter (Signed)
Fatigue is to be expected given her bleeding and low hemoglobin.  If she develops dizziness, shortness of breath, chest pain, or uncontrollable bleeding- these are all reasons to be evaluated in the ER.

## 2022-09-15 NOTE — Telephone Encounter (Signed)
Informed  pt that she needed to contact Gyn regarding this issue as they have started the workup and made the recommendations . Pt expressed verbal understanding

## 2022-09-15 NOTE — Telephone Encounter (Signed)
Initial Comment Caller has had heavy vaginal bleeding for a month. It has slowed down some since last visit in March and medication was started. However, Hgb was 12 in March and now it is 8. GOTO Facility Not Listed [facility]per Translation No Nurse Assessment Nurse: Andrey Campanile, RN, Marylene Land Date/Time Lamount Cohen Time): 09/15/2022 10:36:06 AM Confirm and document reason for call. If symptomatic, describe symptoms. ---Caller has had heavy vaginal bleeding for a month. It has slowed down some since last visit in March and medication(birth control) was started. However, Hgb was 12 in March and now it is 8 per PCP order. Weakness and dizziness. PCP sent message to patient in portal to start eating iron rich foods. Patient is requesting an iron infusion. Does the patient have any new or worsening symptoms? ---Yes Will a triage be completed? ---Yes Related visit to physician within the last 2 weeks? ---Yes Does the PT have any chronic conditions? (i.e. diabetes, asthma, this includes High risk factors for pregnancy, etc.) ---Yes List chronic conditions. ---Uterine polyps Is the patient pregnant or possibly pregnant? (Ask all females between the ages of 36-55) ---No Is this a behavioral health or substance abuse call? ---No PLEASE NOTE: All timestamps contained within this report are represented as [place] Standard Time. CONFIDENTIALTY NOTICE: This fax transmission is intended only for the addressee. It contains information that is legally privileged, confidential or otherwise protected from use or disclosure. If you are not the intended recipient, you are strictly prohibited from reviewing, disclosing, copying using or disseminating any of this information or taking any action in reliance on or regarding this information. If you have received this fax in error, please notify us immediately by telephone so that we can arrange for its return to [place]. Phone: [phone], Toll-Free:  [phone], Fax: [phone] Page: 2 of 2 Call Id: [id number] Guidelines Guideline Title Affirmed Question Affirmed Notes Nurse Date/Time Lamount Cohen Time) Weakness (Generalized) and Fatigue Difficulty breathing Andrey Campanile, RN, Marylene Land 09/15/2022 10:40:06 AM Disp. Time Lamount Cohen Time) Disposition Final User 09/15/2022 10:43:55 AM Go to ED Now Yes Andrey Campanile, RN, Marylene Land Final Disposition 09/15/2022 10:43:55 AM Go to ED Now Yes Andrey Campanile, RN, Rosalyn Charters Disagree/Comply Comply Caller Understands Yes PreDisposition Call Doctor Care Advice Given Per Guideline GO TO ED NOW: * You need to be seen in the Emergency Department. NOTE TO TRIAGER - DRIVING: * Another adult should drive. BRING MEDICINES: * Bring a list of your current medicines when you go to the Emergency Department (ER). * Bring the pill bottles too. This will help the doctor (or NP/PA) to make certain you are taking the right medicines and the right dose. Comments User: Frances Dice, RN Date/Time Lamount Cohen Time): 09/15/2022 10:45:38 AM Frequent SOB, states she has to sit down a lot when it happens. Referrals GO TO FACILITY REFUSED GO TO FACILITY OTHER - SPECIFY    Spoke with pt she is at home refused to go to ER stating she's just tired no SOB. Waiting on GYN to call for further advise.

## 2022-09-15 NOTE — Telephone Encounter (Addendum)
FYI: This call has been transferred to Access Nurse. Once the result note has been entered staff can address the message at that time.  Patient called in with the following symptoms:  Red Word: Heavy vaginal bleed    hemoglobin level is 8   Please advise at Mobile [phone] (mobile)  Message is routed to Provider Pool and [facility] Triage

## 2022-09-15 NOTE — Telephone Encounter (Signed)
If she's having heavy vaginal bleeding at this time, she needs to call GYN ASAP as they may need to do a procedure or admit her for anemia.

## 2022-09-15 NOTE — Telephone Encounter (Signed)
GYN has already started a workup for this and has already made recommendations.  In their note, they indicated she needed to follow up w/ them regarding bleeding/anemia.  This is something she needs to contact them about.

## 2022-09-15 NOTE — Addendum Note (Signed)
Addended by: Eldred Manges on: 09/15/2022 10:37 AM   Modules accepted: Orders

## 2022-09-15 NOTE — Telephone Encounter (Signed)
Spoke to the pt and she states the bleeding has stopped for now .  She is on Monjouro and her HGB is a 8 she wants an iron infusion

## 2022-09-16 DIAGNOSIS — D500 Iron deficiency anemia secondary to blood loss (chronic): Secondary | ICD-10-CM | POA: Diagnosis not present

## 2022-09-16 DIAGNOSIS — R42 Dizziness and giddiness: Secondary | ICD-10-CM | POA: Diagnosis not present

## 2022-09-16 DIAGNOSIS — R0602 Shortness of breath: Secondary | ICD-10-CM | POA: Diagnosis not present

## 2022-09-16 DIAGNOSIS — R5383 Other fatigue: Secondary | ICD-10-CM | POA: Diagnosis not present

## 2022-09-16 DIAGNOSIS — Z7984 Long term (current) use of oral hypoglycemic drugs: Secondary | ICD-10-CM | POA: Diagnosis not present

## 2022-09-16 DIAGNOSIS — E119 Type 2 diabetes mellitus without complications: Secondary | ICD-10-CM | POA: Diagnosis not present

## 2022-09-16 NOTE — Telephone Encounter (Signed)
I spoke to the pt and she states the GYN called and stated her iron was a 6 and she went to the ER that is where she is now .

## 2022-09-22 DIAGNOSIS — N939 Abnormal uterine and vaginal bleeding, unspecified: Secondary | ICD-10-CM | POA: Diagnosis not present

## 2022-09-22 DIAGNOSIS — Z01812 Encounter for preprocedural laboratory examination: Secondary | ICD-10-CM | POA: Diagnosis not present

## 2022-09-26 ENCOUNTER — Encounter: Payer: Self-pay | Admitting: Family Medicine

## 2022-09-26 NOTE — Telephone Encounter (Signed)
Does not appear you have prescribed this in the past please advise

## 2022-09-29 MED ORDER — MOUNJARO 12.5 MG/0.5ML ~~LOC~~ SOAJ: 12.5000 mg | SUBCUTANEOUS | 1 refills | Status: DC

## 2022-09-30 NOTE — Telephone Encounter (Signed)
Opened in error

## 2022-10-08 DIAGNOSIS — N7011 Chronic salpingitis: Secondary | ICD-10-CM | POA: Diagnosis not present

## 2022-10-13 DIAGNOSIS — Z862 Personal history of diseases of the blood and blood-forming organs and certain disorders involving the immune mechanism: Secondary | ICD-10-CM | POA: Diagnosis not present

## 2022-10-13 DIAGNOSIS — N7011 Chronic salpingitis: Secondary | ICD-10-CM | POA: Diagnosis not present

## 2022-10-13 DIAGNOSIS — N939 Abnormal uterine and vaginal bleeding, unspecified: Secondary | ICD-10-CM | POA: Diagnosis not present

## 2022-12-05 ENCOUNTER — Ambulatory Visit: Payer: BC Managed Care – PPO | Admitting: Family Medicine

## 2022-12-05 ENCOUNTER — Encounter: Payer: Self-pay | Admitting: Family Medicine

## 2022-12-05 VITALS — BP 138/82 | HR 107 | Temp 98.3°F | Resp 17 | Ht 67.0 in | Wt 187.0 lb

## 2022-12-05 DIAGNOSIS — I10 Essential (primary) hypertension: Secondary | ICD-10-CM

## 2022-12-05 DIAGNOSIS — D500 Iron deficiency anemia secondary to blood loss (chronic): Secondary | ICD-10-CM | POA: Diagnosis not present

## 2022-12-05 DIAGNOSIS — E119 Type 2 diabetes mellitus without complications: Secondary | ICD-10-CM | POA: Diagnosis not present

## 2022-12-05 DIAGNOSIS — E559 Vitamin D deficiency, unspecified: Secondary | ICD-10-CM

## 2022-12-05 LAB — CBC WITH DIFFERENTIAL/PLATELET
Basophils Absolute: 0 10*3/uL (ref 0.0–0.1)
Basophils Relative: 0.6 % (ref 0.0–3.0)
Eosinophils Absolute: 0.1 10*3/uL (ref 0.0–0.7)
Eosinophils Relative: 1 % (ref 0.0–5.0)
HCT: 40.1 % (ref 36.0–46.0)
Hemoglobin: 12.7 g/dL (ref 12.0–15.0)
Lymphocytes Relative: 25.5 % (ref 12.0–46.0)
Lymphs Abs: 2 10*3/uL (ref 0.7–4.0)
MCHC: 31.7 g/dL (ref 30.0–36.0)
MCV: 84.2 fl (ref 78.0–100.0)
Monocytes Absolute: 0.7 10*3/uL (ref 0.1–1.0)
Monocytes Relative: 8.9 % (ref 3.0–12.0)
Neutro Abs: 5 10*3/uL (ref 1.4–7.7)
Neutrophils Relative %: 64 % (ref 43.0–77.0)
Platelets: 394 10*3/uL (ref 150.0–400.0)
RBC: 4.76 Mil/uL (ref 3.87–5.11)
RDW: 15.9 % — ABNORMAL HIGH (ref 11.5–15.5)
WBC: 7.8 10*3/uL (ref 4.0–10.5)

## 2022-12-05 LAB — BASIC METABOLIC PANEL
BUN: 6 mg/dL (ref 6–23)
CO2: 25 mEq/L (ref 19–32)
Calcium: 9.3 mg/dL (ref 8.4–10.5)
Chloride: 106 mEq/L (ref 96–112)
Creatinine, Ser: 0.84 mg/dL (ref 0.40–1.20)
GFR: 85.1 mL/min (ref >60.00)
Glucose, Bld: 78 mg/dL (ref 70–99)
Potassium: 3.8 mEq/L (ref 3.5–5.1)
Sodium: 137 mEq/L (ref 135–145)

## 2022-12-05 LAB — HEMOGLOBIN A1C: Hgb A1c MFr Bld: 5.2 % (ref 4.6–6.5)

## 2022-12-05 LAB — MICROALBUMIN / CREATININE URINE RATIO
Creatinine,U: 132.6 mg/dL
Microalb Creat Ratio: 0.5 mg/g (ref 0.0–30.0)
Microalb, Ur: <0.7 mg/dL (ref 0.0–1.9)

## 2022-12-05 LAB — VITAMIN D 25 HYDROXY (VIT D DEFICIENCY, FRACTURES): VITD: 39.94 ng/mL (ref 30.00–100.00)

## 2022-12-05 MED ORDER — TIRZEPATIDE 15 MG/0.5ML ~~LOC~~ SOAJ: 15.0000 mg | SUBCUTANEOUS | 1 refills | Status: DC

## 2022-12-05 MED ORDER — BENAZEPRIL-HYDROCHLOROTHIAZIDE 20-25 MG PO TABS: 1.0000 | ORAL_TABLET | Freq: Every day | ORAL | 1 refills | Status: DC

## 2022-12-05 MED ORDER — AMLODIPINE BESYLATE 10 MG PO TABS: 10.0000 mg | ORAL_TABLET | Freq: Every day | ORAL | 1 refills | Status: DC

## 2022-12-05 NOTE — Patient Instructions (Signed)
Schedule your complete physical in 4 months We'll notify you of your lab results and make any changes if needed INCREASE the Mounjaro to 15mg  weekly CONTINUE the Benazepril hydrochlorothiazide 1 tab daily Keep up the good work on healthy diet and regular exercise- you can do it! Call with any questions or concerns Have a great summer!!!

## 2022-12-05 NOTE — Progress Notes (Signed)
   Subjective:    Patient ID: Frances Randolph, female    DOB: 30-Apr-1981, 42 y.o.   MRN: [id number]  HPI DM- chronic problem.  Has gained 7 lbs since last visit.  Last A1C 4.5%  UTD on eye exam, foot exam.  Currently on Mounjaro 12.5mg  daily.  On ACE for renal protection but due for microalbumin.  Pt is interested in increasing Mounjaro dose.  Denies symptomatic lows.  No numbness/tingling of hands/feet.  HTN- at last visit we stopped Amlodipine and decreased benazepril hydrochlorothiazide to 1 tab daily.  BP at home has been running 110s/70s.  No CP, SOB, HA's, visual changes, edema.    Anemia- pt is asking to repeat CBC and iron panel due to abnormal uterine bleeding in April.  Reports energy is better and she has not had any excessive bleeding   Review of Systems For ROS see HPI     Objective:   Physical Exam Vitals reviewed.  Constitutional:      General: She is not in acute distress.    Appearance: Normal appearance. She is well-developed. She is not ill-appearing.  HENT:     Head: Normocephalic and atraumatic.  Eyes:     Conjunctiva/sclera: Conjunctivae normal.     Pupils: Pupils are equal, round, and reactive to light.  Neck:     Thyroid: No thyromegaly.  Cardiovascular:     Rate and Rhythm: Normal rate and regular rhythm.     Pulses: Normal pulses.     Heart sounds: Normal heart sounds. No murmur heard. Pulmonary:     Effort: Pulmonary effort is normal. No respiratory distress.     Breath sounds: Normal breath sounds.  Abdominal:     General: There is no distension.     Palpations: Abdomen is soft.     Tenderness: There is no abdominal tenderness.  Musculoskeletal:     Cervical back: Normal range of motion and neck supple.     Right lower leg: No edema.     Left lower leg: No edema.  Lymphadenopathy:     Cervical: No cervical adenopathy.  Skin:    General: Skin is warm and dry.  Neurological:     General: No focal deficit present.     Mental Status: She is  alert and oriented to person, place, and time.  Psychiatric:        Mood and Affect: Mood normal.        Behavior: Behavior normal.        Thought Content: Thought content normal.           Assessment & Plan:

## 2022-12-05 NOTE — Assessment & Plan Note (Signed)
Check labs and replete prn. 

## 2022-12-05 NOTE — Assessment & Plan Note (Signed)
Chronic problem.  At last visit we stopped Amlodipine and decreased Benazepril hydrochlorothiazide to 1 tab daily.  Home BP's have been well controlled.  Currently asymptomatic.  No med changes at this time.  Will continue to follow.

## 2022-12-05 NOTE — Assessment & Plan Note (Signed)
Pt has hx of this.  She wants to make sure that counts still look good and are not dropping.  Thankfully she has not had any recent or excessive bleeding and she is asymptomatic.  Check CBC and iron panel.

## 2022-12-05 NOTE — Assessment & Plan Note (Signed)
Chronic problem.  Pt has hx of excellent A1C control but has gained 7 lbs since last visit despite taking Mounjaro.  She is interested in increasing to 15mg  weekly (max dose).  UTD on eye exam, foot exam.  Microalbumin ordered.  Currently asymptomatic.  Stressed need for low carb diet and regular exercise while on medication.  Pt expressed understanding and is in agreement w/ plan.

## 2022-12-06 LAB — IRON,TIBC AND FERRITIN PANEL
%SAT: 20 % (calc) (ref 16–45)
Ferritin: 23 ng/mL (ref 16–232)
Iron: 71 ug/dL (ref 40–190)
TIBC: 351 mcg/dL (calc) (ref 250–450)

## 2022-12-10 ENCOUNTER — Telehealth: Payer: Self-pay

## 2022-12-10 NOTE — Telephone Encounter (Signed)
-----   Message from Alveria Apley, NP sent at 12/10/2022 12:27 PM EDT ----- I am covering for Dr. Beverely Low while she is out of office. All your labs are stable. No concerns.

## 2022-12-10 NOTE — Telephone Encounter (Signed)
Pt seen results Via my chart  

## 2022-12-16 ENCOUNTER — Other Ambulatory Visit: Payer: Self-pay

## 2023-01-08 ENCOUNTER — Other Ambulatory Visit: Payer: Self-pay | Admitting: Family Medicine

## 2023-01-08 DIAGNOSIS — Z01419 Encounter for gynecological examination (general) (routine) without abnormal findings: Secondary | ICD-10-CM | POA: Diagnosis not present

## 2023-01-08 DIAGNOSIS — E559 Vitamin D deficiency, unspecified: Secondary | ICD-10-CM

## 2023-01-08 DIAGNOSIS — Z1231 Encounter for screening mammogram for malignant neoplasm of breast: Secondary | ICD-10-CM | POA: Diagnosis not present

## 2023-01-08 DIAGNOSIS — N840 Polyp of corpus uteri: Secondary | ICD-10-CM | POA: Diagnosis not present

## 2023-01-08 DIAGNOSIS — N939 Abnormal uterine and vaginal bleeding, unspecified: Secondary | ICD-10-CM | POA: Diagnosis not present

## 2023-03-04 LAB — HM DIABETES EYE EXAM

## 2023-03-11 ENCOUNTER — Encounter: Payer: Self-pay | Admitting: Family Medicine

## 2023-04-06 DIAGNOSIS — R92323 Mammographic fibroglandular density, bilateral breasts: Secondary | ICD-10-CM | POA: Diagnosis not present

## 2023-04-06 DIAGNOSIS — Z862 Personal history of diseases of the blood and blood-forming organs and certain disorders involving the immune mechanism: Secondary | ICD-10-CM | POA: Diagnosis not present

## 2023-04-06 DIAGNOSIS — N840 Polyp of corpus uteri: Secondary | ICD-10-CM | POA: Diagnosis not present

## 2023-04-06 DIAGNOSIS — Z1231 Encounter for screening mammogram for malignant neoplasm of breast: Secondary | ICD-10-CM | POA: Diagnosis not present

## 2023-04-06 DIAGNOSIS — N939 Abnormal uterine and vaginal bleeding, unspecified: Secondary | ICD-10-CM | POA: Diagnosis not present

## 2023-04-06 LAB — HM MAMMOGRAPHY

## 2023-04-13 ENCOUNTER — Encounter: Payer: Self-pay | Admitting: Family Medicine

## 2023-04-13 ENCOUNTER — Ambulatory Visit (INDEPENDENT_AMBULATORY_CARE_PROVIDER_SITE_OTHER): Payer: BC Managed Care – PPO | Admitting: Family Medicine

## 2023-04-13 VITALS — BP 122/78 | HR 88 | Temp 98.7°F | Ht 67.0 in | Wt 173.5 lb

## 2023-04-13 DIAGNOSIS — Z0000 Encounter for general adult medical examination without abnormal findings: Secondary | ICD-10-CM

## 2023-04-13 DIAGNOSIS — Z1159 Encounter for screening for other viral diseases: Secondary | ICD-10-CM

## 2023-04-13 DIAGNOSIS — E559 Vitamin D deficiency, unspecified: Secondary | ICD-10-CM | POA: Diagnosis not present

## 2023-04-13 DIAGNOSIS — Z23 Encounter for immunization: Secondary | ICD-10-CM

## 2023-04-13 DIAGNOSIS — Z114 Encounter for screening for human immunodeficiency virus [HIV]: Secondary | ICD-10-CM | POA: Diagnosis not present

## 2023-04-13 DIAGNOSIS — E119 Type 2 diabetes mellitus without complications: Secondary | ICD-10-CM

## 2023-04-13 LAB — CBC WITH DIFFERENTIAL/PLATELET
Basophils Absolute: 0 10*3/uL (ref 0.0–0.1)
Basophils Relative: 0.7 % (ref 0.0–3.0)
Eosinophils Absolute: 0.1 10*3/uL (ref 0.0–0.7)
Eosinophils Relative: 1.4 % (ref 0.0–5.0)
HCT: 43.2 % (ref 36.0–46.0)
Hemoglobin: 13.9 g/dL (ref 12.0–15.0)
Lymphocytes Relative: 33.7 % (ref 12.0–46.0)
Lymphs Abs: 1.6 10*3/uL (ref 0.7–4.0)
MCHC: 32.1 g/dL (ref 30.0–36.0)
MCV: 87.8 fL (ref 78.0–100.0)
Monocytes Absolute: 0.5 10*3/uL (ref 0.1–1.0)
Monocytes Relative: 9.5 % (ref 3.0–12.0)
Neutro Abs: 2.6 10*3/uL (ref 1.4–7.7)
Neutrophils Relative %: 54.7 % (ref 43.0–77.0)
Platelets: 413 10*3/uL — ABNORMAL HIGH (ref 150.0–400.0)
RBC: 4.92 Mil/uL (ref 3.87–5.11)
RDW: 13.5 % (ref 11.5–15.5)
WBC: 4.8 10*3/uL (ref 4.0–10.5)

## 2023-04-13 LAB — BASIC METABOLIC PANEL
BUN: 6 mg/dL (ref 6–23)
CO2: 28 meq/L (ref 19–32)
Calcium: 9.8 mg/dL (ref 8.4–10.5)
Chloride: 102 meq/L (ref 96–112)
Creatinine, Ser: 0.88 mg/dL (ref 0.40–1.20)
GFR: 80.28 mL/min (ref >60.00)
Glucose, Bld: 71 mg/dL (ref 70–99)
Potassium: 3.8 meq/L (ref 3.5–5.1)
Sodium: 138 meq/L (ref 135–145)

## 2023-04-13 LAB — HEPATIC FUNCTION PANEL
ALT: 20 U/L (ref 0–35)
AST: 23 U/L (ref 0–37)
Albumin: 4.4 g/dL (ref 3.5–5.2)
Alkaline Phosphatase: 73 U/L (ref 39–117)
Bilirubin, Direct: 0.1 mg/dL (ref 0.0–0.3)
Total Bilirubin: 0.6 mg/dL (ref 0.2–1.2)
Total Protein: 7.8 g/dL (ref 6.0–8.3)

## 2023-04-13 LAB — LIPID PANEL
Cholesterol: 204 mg/dL — ABNORMAL HIGH (ref 0–200)
HDL: 46.7 mg/dL (ref >39.00)
LDL Cholesterol: 128 mg/dL — ABNORMAL HIGH (ref 0–99)
NonHDL: 157.71
Total CHOL/HDL Ratio: 4
Triglycerides: 151 mg/dL — ABNORMAL HIGH (ref 0.0–149.0)
VLDL: 30.2 mg/dL (ref 0.0–40.0)

## 2023-04-13 LAB — VITAMIN D 25 HYDROXY (VIT D DEFICIENCY, FRACTURES): VITD: 20.72 ng/mL — ABNORMAL LOW (ref 30.00–100.00)

## 2023-04-13 LAB — HEMOGLOBIN A1C: Hgb A1c MFr Bld: 4.9 % (ref 4.6–6.5)

## 2023-04-13 LAB — TSH: TSH: 3.16 u[IU]/mL (ref 0.35–5.50)

## 2023-04-13 NOTE — Assessment & Plan Note (Signed)
Pt's PE WNL.  UTD on pap, mammo, eye exam, foot exam, microalbumin.  Check labs.  Anticipatory guidance provided.

## 2023-04-13 NOTE — Patient Instructions (Signed)
Follow up in 6 months to recheck sugar, blood pressure, and cholesterol We'll notify you of your lab results and make any changes if needed Keep up the good work on healthy diet and regular exercise- you look great! Add a daily Claritin or Zyrtec to help w/ increased mucous Call with any questions or concerns Stay Safe!  Stay Healthy! Happy Fall!!!

## 2023-04-13 NOTE — Progress Notes (Signed)
   Subjective:    Patient ID: Frances Randolph, female    DOB: Nov 17, 1980, 42 y.o.   MRN: [id number]  HPI CPE- UTD on pap, eye exam, foot exam, microalbumin.  Had mammo done at [facility]- WNL.  Down 15 lbs since starting Mounjaro.  Due for Tdap.  Patient Care Team    Relationship Specialty Notifications Start End  Sheliah Hatch, MD PCP - General   05/22/10   Davis Gourd, MD Referring Physician Obstetrics  10/23/22   [facility]    12/16/22     Health Maintenance  Topic Date Due   COVID-19 Vaccine (1) Never done   HIV Screening  Never done   Hepatitis C Screening  Never done   DTaP/Tdap/Td (3 - Tdap) 12/20/2016   INFLUENZA VACCINE  Never done   HEMOGLOBIN A1C  06/06/2023   FOOT EXAM  09/12/2023   Diabetic kidney evaluation - eGFR measurement  12/05/2023   Diabetic kidney evaluation - Urine ACR  12/05/2023   OPHTHALMOLOGY EXAM  03/03/2024   Cervical Cancer Screening (HPV/Pap Cotest)  05/23/2025   HPV VACCINES  Aged Out      Review of Systems Patient reports no vision/ hearing changes, adenopathy,fever, weight change,  persistant/recurrent hoarseness , swallowing issues, chest pain, palpitations, edema, persistant/recurrent cough, hemoptysis, dyspnea (rest/exertional/paroxysmal nocturnal), gastrointestinal bleeding (melena, rectal bleeding), abdominal pain, significant heartburn, bowel changes, GU symptoms (dysuria, hematuria, incontinence), Gyn symptoms (abnormal  bleeding, pain),  syncope, focal weakness, memory loss, numbness & tingling, skin/hair/nail changes, abnormal bruising or bleeding, anxiety, or depression.     Objective:   Physical Exam General Appearance:    Alert, cooperative, no distress, appears stated age  Head:    Normocephalic, without obvious abnormality, atraumatic  Eyes:    PERRL, conjunctiva/corneas clear, EOM's intact, fundi    benign, both eyes  Ears:    Normal TM's and external ear canals, both ears  Nose:   Nares normal,  septum midline, mucosa normal, no drainage    or sinus tenderness  Throat:   Lips, mucosa, and tongue normal; teeth and gums normal  Neck:   Supple, symmetrical, trachea midline, no adenopathy;    Thyroid: no enlargement/tenderness/nodules  Back:     Symmetric, no curvature, ROM normal, no CVA tenderness  Lungs:     Clear to auscultation bilaterally, respirations unlabored  Chest Wall:    No tenderness or deformity   Heart:    Regular rate and rhythm, S1 and S2 normal, no murmur, rub   or gallop  Breast Exam:    Deferred to mammo  Abdomen:     Soft, non-tender, bowel sounds active all four quadrants,    no masses, no organomegaly  Genitalia:    Deferred  Rectal:    Extremities:   Extremities normal, atraumatic, no cyanosis or edema  Pulses:   2+ and symmetric all extremities  Skin:   Skin color, texture, turgor normal, no rashes or lesions  Lymph nodes:   Cervical, supraclavicular, and axillary nodes normal  Neurologic:   CNII-XII intact, normal strength, sensation and reflexes    throughout          Assessment & Plan:

## 2023-04-13 NOTE — Assessment & Plan Note (Signed)
Check labs and replete prn. 

## 2023-04-14 ENCOUNTER — Other Ambulatory Visit: Payer: Self-pay

## 2023-04-14 ENCOUNTER — Encounter: Payer: Self-pay | Admitting: Family Medicine

## 2023-04-14 ENCOUNTER — Telehealth: Payer: Self-pay

## 2023-04-14 DIAGNOSIS — E119 Type 2 diabetes mellitus without complications: Secondary | ICD-10-CM

## 2023-04-14 DIAGNOSIS — E559 Vitamin D deficiency, unspecified: Secondary | ICD-10-CM

## 2023-04-14 LAB — HEPATITIS C ANTIBODY: Hepatitis C Ab: NONREACTIVE

## 2023-04-14 LAB — HIV ANTIBODY (ROUTINE TESTING W REFLEX): HIV 1&2 Ab, 4th Generation: NONREACTIVE

## 2023-04-14 MED ORDER — VITAMIN D (ERGOCALCIFEROL) 1.25 MG (50000 UNIT) PO CAPS: 50000.0000 [IU] | ORAL_CAPSULE | ORAL | 0 refills | Status: AC

## 2023-04-14 MED ORDER — BENAZEPRIL-HYDROCHLOROTHIAZIDE 20-25 MG PO TABS: 1.0000 | ORAL_TABLET | Freq: Every day | ORAL | 1 refills | Status: DC

## 2023-04-14 MED ORDER — TIRZEPATIDE 15 MG/0.5ML ~~LOC~~ SOAJ: 15.0000 mg | SUBCUTANEOUS | 1 refills | Status: DC

## 2023-04-14 NOTE — Telephone Encounter (Signed)
-----   Message from Neena Rhymes sent at 04/14/2023  8:05 AM EST ----- Vit D is low.  Based on this, we need to start 50,000 units weekly x12 weeks in addition to daily OTC supplement of at least 2000 units.   Your total cholesterol and LDL (bad cholesterol) have both increased.  Neither is bad, but the LDL goal for someone with diabetes is <70 (yours is 128).  Based on this, I recommend starting Crestor 5mg  daily (#90, 1 refill) and repeating your liver functions at a lab only visit in 6 weeks to ensure you are metabolizing the medication appropriately (LFTs, dx hyperlipidemia)  Remainder of labs look great!!!

## 2023-07-06 DIAGNOSIS — N7011 Chronic salpingitis: Secondary | ICD-10-CM | POA: Diagnosis not present

## 2023-07-06 DIAGNOSIS — N840 Polyp of corpus uteri: Secondary | ICD-10-CM | POA: Diagnosis not present

## 2023-08-09 ENCOUNTER — Other Ambulatory Visit: Payer: Self-pay | Admitting: Family Medicine

## 2023-08-09 DIAGNOSIS — E559 Vitamin D deficiency, unspecified: Secondary | ICD-10-CM

## 2023-10-12 ENCOUNTER — Ambulatory Visit: Payer: BC Managed Care – PPO | Admitting: Family Medicine

## 2023-10-13 ENCOUNTER — Ambulatory Visit: Admitting: Family Medicine

## 2023-10-13 ENCOUNTER — Encounter: Payer: Self-pay | Admitting: Family Medicine

## 2023-10-13 VITALS — BP 156/92 | HR 92 | Wt 180.0 lb

## 2023-10-13 DIAGNOSIS — E785 Hyperlipidemia, unspecified: Secondary | ICD-10-CM | POA: Diagnosis not present

## 2023-10-13 DIAGNOSIS — E1169 Type 2 diabetes mellitus with other specified complication: Secondary | ICD-10-CM | POA: Diagnosis not present

## 2023-10-13 DIAGNOSIS — I10 Essential (primary) hypertension: Secondary | ICD-10-CM | POA: Diagnosis not present

## 2023-10-13 DIAGNOSIS — E119 Type 2 diabetes mellitus without complications: Secondary | ICD-10-CM

## 2023-10-13 LAB — HEPATIC FUNCTION PANEL
ALT: 17 U/L (ref 0–35)
AST: 18 U/L (ref 0–37)
Albumin: 4 g/dL (ref 3.5–5.2)
Alkaline Phosphatase: 55 U/L (ref 39–117)
Bilirubin, Direct: 0.1 mg/dL (ref 0.0–0.3)
Total Bilirubin: 0.5 mg/dL (ref 0.2–1.2)
Total Protein: 6.9 g/dL (ref 6.0–8.3)

## 2023-10-13 LAB — CBC WITH DIFFERENTIAL/PLATELET
Basophils Absolute: 0 10*3/uL (ref 0.0–0.1)
Basophils Relative: 0.6 % (ref 0.0–3.0)
Eosinophils Absolute: 0.1 10*3/uL (ref 0.0–0.7)
Eosinophils Relative: 1.6 % (ref 0.0–5.0)
HCT: 41.4 % (ref 36.0–46.0)
Hemoglobin: 13.4 g/dL (ref 12.0–15.0)
Lymphocytes Relative: 32.9 % (ref 12.0–46.0)
Lymphs Abs: 2 10*3/uL (ref 0.7–4.0)
MCHC: 32.3 g/dL (ref 30.0–36.0)
MCV: 89 fl (ref 78.0–100.0)
Monocytes Absolute: 0.6 10*3/uL (ref 0.1–1.0)
Monocytes Relative: 9.5 % (ref 3.0–12.0)
Neutro Abs: 3.3 10*3/uL (ref 1.4–7.7)
Neutrophils Relative %: 55.4 % (ref 43.0–77.0)
Platelets: 385 10*3/uL (ref 150.0–400.0)
RBC: 4.65 Mil/uL (ref 3.87–5.11)
RDW: 13.3 % (ref 11.5–15.5)
WBC: 5.9 10*3/uL (ref 4.0–10.5)

## 2023-10-13 LAB — LIPID PANEL
Cholesterol: 190 mg/dL (ref 0–200)
HDL: 51.8 mg/dL (ref >39.00)
LDL Cholesterol: 115 mg/dL — ABNORMAL HIGH (ref 0–99)
NonHDL: 138.64
Total CHOL/HDL Ratio: 4
Triglycerides: 119 mg/dL (ref 0.0–149.0)
VLDL: 23.8 mg/dL (ref 0.0–40.0)

## 2023-10-13 LAB — BASIC METABOLIC PANEL WITH GFR
BUN: 6 mg/dL (ref 6–23)
CO2: 27 meq/L (ref 19–32)
Calcium: 9.1 mg/dL (ref 8.4–10.5)
Chloride: 103 meq/L (ref 96–112)
Creatinine, Ser: 0.81 mg/dL (ref 0.40–1.20)
GFR: 88.37 mL/min (ref >60.00)
Glucose, Bld: 69 mg/dL — ABNORMAL LOW (ref 70–99)
Potassium: 3.6 meq/L (ref 3.5–5.1)
Sodium: 136 meq/L (ref 135–145)

## 2023-10-13 LAB — HEMOGLOBIN A1C: Hgb A1c MFr Bld: 5.2 % (ref 4.6–6.5)

## 2023-10-13 LAB — TSH: TSH: 2.14 u[IU]/mL (ref 0.35–5.50)

## 2023-10-13 NOTE — Progress Notes (Signed)
   Subjective:    Patient ID: Frances Randolph, female    DOB: [birth year], 43 y.o.   MRN: [id number]  HPI DM- chronic problem, on Mounjaro  15mg  daily.  Due for foot exam.  UTD on microalbumin, eye exam.  No abd pain, N/V.  No numbness/tingling of hands/feet.  HTN- chronic problem, on Benazepril  hydrochlorothiazide  20/25mg  daily.  BP is elevated today.  Saw mom- who has dementia- this morning and mom is not pleasant or nice.  No CP, SOB, HA's, visual changes, edema.  Hyperlipidemia- ongoing issue.  Pt attempting to control w/ diet and exercise.  Has gained 7 lbs.  Last LDL 128.   Review of Systems For ROS see HPI     Objective:   Physical Exam Vitals reviewed.  Constitutional:      General: She is not in acute distress.    Appearance: Normal appearance. She is well-developed. She is not ill-appearing.  HENT:     Head: Normocephalic and atraumatic.  Eyes:     Conjunctiva/sclera: Conjunctivae normal.     Pupils: Pupils are equal, round, and reactive to light.  Neck:     Thyroid : No thyromegaly.  Cardiovascular:     Rate and Rhythm: Normal rate and regular rhythm.     Heart sounds: Normal heart sounds. No murmur heard. Pulmonary:     Effort: Pulmonary effort is normal. No respiratory distress.     Breath sounds: Normal breath sounds.  Abdominal:     General: There is no distension.     Palpations: Abdomen is soft.     Tenderness: There is no abdominal tenderness.  Musculoskeletal:     Cervical back: Normal range of motion and neck supple.  Lymphadenopathy:     Cervical: No cervical adenopathy.  Skin:    General: Skin is warm and dry.  Neurological:     Mental Status: She is alert and oriented to person, place, and time.  Psychiatric:        Behavior: Behavior normal.           Assessment & Plan:

## 2023-10-13 NOTE — Assessment & Plan Note (Signed)
 Chronic problem.  On Mounjaro  15mg  daily w/o difficulty.  Foot exam done today.  UTD on microalbumin and eye exam.  Currently asymptomatic.  Check labs.  Adjust meds prn

## 2023-10-13 NOTE — Assessment & Plan Note (Signed)
 Ongoing issue for pt.  Last LDL 128.  Goal for someone w/ DM is <70.  She is attempting to control w/ diet and exercise.  Check labs and if not improved, will need to start meds.

## 2023-10-13 NOTE — Patient Instructions (Signed)
 Schedule your complete physical in 6 months We'll notify you of your lab results and make any changes if needed Continue to work on healthy diet and regular exercise- you look great! Make sure you take time for yourself- you deserve to grieve and feel better Call with any questions or concerns Stay Safe!  Stay Healthy! Hang in there!!!

## 2023-10-13 NOTE — Assessment & Plan Note (Signed)
 Deteriorated.  Pt reports she had a stressful morning w/ her mother who has dementia and tends to be mean.  States she has had BP checked on other occasions and it has been normal.  Currently asymptomatic.  Will continue Benazepril  hydrochlorothiazide  20/25mg  daily and monitor.  Check labs due to ACE and diuretic use but no anticipated med changes.

## 2023-10-14 ENCOUNTER — Telehealth: Payer: Self-pay

## 2023-10-14 ENCOUNTER — Encounter: Payer: Self-pay | Admitting: Family Medicine

## 2023-10-14 DIAGNOSIS — E119 Type 2 diabetes mellitus without complications: Secondary | ICD-10-CM

## 2023-10-14 NOTE — Telephone Encounter (Signed)
 Pt has reviewed via MyChart

## 2023-10-14 NOTE — Telephone Encounter (Signed)
-----   Message from Laymon Priest sent at 10/14/2023  7:17 AM EDT ----- Labs look great!  No changes at this time

## 2023-10-26 MED ORDER — BENAZEPRIL-HYDROCHLOROTHIAZIDE 20-25 MG PO TABS: 1.0000 | ORAL_TABLET | Freq: Every day | ORAL | 1 refills | Status: AC

## 2023-10-26 MED ORDER — TIRZEPATIDE 15 MG/0.5ML ~~LOC~~ SOAJ: 15.0000 mg | SUBCUTANEOUS | 1 refills | Status: DC

## 2023-10-28 MED ORDER — POTASSIUM CHLORIDE CRYS ER 20 MEQ PO TBCR: 20.0000 meq | EXTENDED_RELEASE_TABLET | Freq: Every day | ORAL | 2 refills | Status: AC

## 2023-10-28 NOTE — Addendum Note (Signed)
 Addended by: Patryce Depriest E on: 10/28/2023 07:24 AM   Modules accepted: Orders

## 2023-11-09 DIAGNOSIS — N840 Polyp of corpus uteri: Secondary | ICD-10-CM | POA: Insufficient documentation

## 2023-11-19 DIAGNOSIS — N926 Irregular menstruation, unspecified: Secondary | ICD-10-CM | POA: Diagnosis not present

## 2023-11-20 DIAGNOSIS — R000 Tachycardia, unspecified: Secondary | ICD-10-CM | POA: Diagnosis not present

## 2023-11-20 DIAGNOSIS — D649 Anemia, unspecified: Secondary | ICD-10-CM | POA: Diagnosis not present

## 2023-11-20 DIAGNOSIS — N939 Abnormal uterine and vaginal bleeding, unspecified: Secondary | ICD-10-CM | POA: Diagnosis not present

## 2023-11-20 DIAGNOSIS — E119 Type 2 diabetes mellitus without complications: Secondary | ICD-10-CM | POA: Diagnosis not present

## 2023-11-20 DIAGNOSIS — Z7985 Long-term (current) use of injectable non-insulin antidiabetic drugs: Secondary | ICD-10-CM | POA: Diagnosis not present

## 2023-11-20 DIAGNOSIS — I10 Essential (primary) hypertension: Secondary | ICD-10-CM | POA: Diagnosis not present

## 2023-11-20 DIAGNOSIS — R002 Palpitations: Secondary | ICD-10-CM | POA: Diagnosis not present

## 2023-11-27 DIAGNOSIS — D509 Iron deficiency anemia, unspecified: Secondary | ICD-10-CM | POA: Diagnosis not present

## 2023-12-01 DIAGNOSIS — Z30013 Encounter for initial prescription of injectable contraceptive: Secondary | ICD-10-CM | POA: Diagnosis not present

## 2023-12-01 DIAGNOSIS — N926 Irregular menstruation, unspecified: Secondary | ICD-10-CM | POA: Diagnosis not present

## 2023-12-01 DIAGNOSIS — N939 Abnormal uterine and vaginal bleeding, unspecified: Secondary | ICD-10-CM | POA: Diagnosis not present

## 2023-12-10 DIAGNOSIS — Z862 Personal history of diseases of the blood and blood-forming organs and certain disorders involving the immune mechanism: Secondary | ICD-10-CM | POA: Diagnosis not present

## 2023-12-14 ENCOUNTER — Other Ambulatory Visit: Payer: Self-pay | Admitting: Family Medicine

## 2023-12-26 ENCOUNTER — Other Ambulatory Visit: Payer: Self-pay | Admitting: Family Medicine

## 2023-12-26 DIAGNOSIS — E119 Type 2 diabetes mellitus without complications: Secondary | ICD-10-CM

## 2023-12-31 DIAGNOSIS — Z01818 Encounter for other preprocedural examination: Secondary | ICD-10-CM | POA: Diagnosis not present

## 2024-01-07 DIAGNOSIS — N840 Polyp of corpus uteri: Secondary | ICD-10-CM | POA: Diagnosis not present

## 2024-01-07 DIAGNOSIS — N939 Abnormal uterine and vaginal bleeding, unspecified: Secondary | ICD-10-CM | POA: Diagnosis not present

## 2024-01-21 DIAGNOSIS — Z862 Personal history of diseases of the blood and blood-forming organs and certain disorders involving the immune mechanism: Secondary | ICD-10-CM | POA: Diagnosis not present

## 2024-02-15 ENCOUNTER — Ambulatory Visit: Admitting: Family Medicine

## 2024-02-15 ENCOUNTER — Encounter: Payer: Self-pay | Admitting: Family Medicine

## 2024-02-15 VITALS — BP 138/90 | HR 120 | Temp 98.6°F | Wt 188.4 lb

## 2024-02-15 DIAGNOSIS — R0982 Postnasal drip: Secondary | ICD-10-CM

## 2024-02-15 MED ORDER — MONTELUKAST SODIUM 10 MG PO TABS: 10.0000 mg | ORAL_TABLET | Freq: Every day | ORAL | 3 refills | Status: DC

## 2024-02-15 MED ORDER — AZELASTINE HCL 0.1 % NA SOLN
1.0000 | Freq: Two times a day (BID) | NASAL | 5 refills | Status: DC
Start: 2024-02-15 — End: 2024-04-21

## 2024-02-15 NOTE — Progress Notes (Signed)
   Subjective:    Patient ID: Frances Randolph, female    DOB: Aug 12, 1980, 43 y.o.   MRN: [id number]  HPI PND- pt reports she has been 'suffering for months' w/ PND.  Has been using Nasonex regularly.  Just add Zyrtec in the last few weeks.  Sxs are fairly constant- can feel it when she swallows.  Denies HA, nasal congestion, ear pain.   Review of Systems For ROS see HPI     Objective:   Physical Exam Vitals reviewed.  Constitutional:      General: She is not in acute distress.    Appearance: Normal appearance. She is not ill-appearing.  HENT:     Head: Normocephalic and atraumatic.     Right Ear: Tympanic membrane and ear canal normal.     Left Ear: Tympanic membrane and ear canal normal.     Nose: Congestion present. No rhinorrhea.     Comments: No TTP over frontal or maxillary sinuses    Mouth/Throat:     Mouth: Mucous membranes are moist.     Pharynx: No oropharyngeal exudate.     Comments: Copious PND Eyes:     Extraocular Movements: Extraocular movements intact.     Conjunctiva/sclera: Conjunctivae normal.  Musculoskeletal:     Cervical back: Neck supple.  Lymphadenopathy:     Cervical: No cervical adenopathy.  Skin:    General: Skin is warm and dry.  Neurological:     General: No focal deficit present.     Mental Status: She is alert and oriented to person, place, and time.  Psychiatric:        Mood and Affect: Mood normal.        Behavior: Behavior normal.        Thought Content: Thought content normal.           Assessment & Plan:   PND- deteriorated.  Pt is using OTC Nasonex and started taking Zyrtec a few weeks ago w/o relief.  Will add Singulair  and Azelastine .  Pt expressed understanding and is in agreement w/ plan.

## 2024-02-15 NOTE — Patient Instructions (Signed)
 Follow up as needed CONTINUE the Zyrtec and Nasonex daily ADD the Azelastine  spray twice daily ADD the Montelukast  (Singulair ) nightly Call with any questions or concerns Hang in there!!!

## 2024-04-21 ENCOUNTER — Encounter: Payer: Self-pay | Admitting: Family Medicine

## 2024-04-21 ENCOUNTER — Ambulatory Visit: Admitting: Family Medicine

## 2024-04-21 ENCOUNTER — Ambulatory Visit: Payer: Self-pay | Admitting: Family Medicine

## 2024-04-21 VITALS — BP 128/74 | HR 97 | Temp 97.8°F | Resp 15 | Ht 67.25 in | Wt 181.8 lb

## 2024-04-21 DIAGNOSIS — Z800 Family history of malignant neoplasm of digestive organs: Secondary | ICD-10-CM

## 2024-04-21 DIAGNOSIS — D500 Iron deficiency anemia secondary to blood loss (chronic): Secondary | ICD-10-CM

## 2024-04-21 DIAGNOSIS — E119 Type 2 diabetes mellitus without complications: Secondary | ICD-10-CM

## 2024-04-21 DIAGNOSIS — Z1211 Encounter for screening for malignant neoplasm of colon: Secondary | ICD-10-CM

## 2024-04-21 DIAGNOSIS — Z0000 Encounter for general adult medical examination without abnormal findings: Secondary | ICD-10-CM

## 2024-04-21 DIAGNOSIS — E559 Vitamin D deficiency, unspecified: Secondary | ICD-10-CM

## 2024-04-21 LAB — HEPATIC FUNCTION PANEL
ALT: 25 U/L (ref 0–35)
AST: 23 U/L (ref 0–37)
Albumin: 4.7 g/dL (ref 3.5–5.2)
Alkaline Phosphatase: 66 U/L (ref 39–117)
Bilirubin, Direct: 0.1 mg/dL (ref 0.0–0.3)
Total Bilirubin: 0.4 mg/dL (ref 0.2–1.2)
Total Protein: 7.4 g/dL (ref 6.0–8.3)

## 2024-04-21 LAB — BASIC METABOLIC PANEL WITH GFR
BUN: 9 mg/dL (ref 6–23)
CO2: 26 meq/L (ref 19–32)
Calcium: 9.7 mg/dL (ref 8.4–10.5)
Chloride: 101 meq/L (ref 96–112)
Creatinine, Ser: 1.09 mg/dL (ref 0.40–1.20)
GFR: 61.65 mL/min (ref >60.00)
Glucose, Bld: 82 mg/dL (ref 70–99)
Potassium: 3.6 meq/L (ref 3.5–5.1)
Sodium: 137 meq/L (ref 135–145)

## 2024-04-21 LAB — CBC WITH DIFFERENTIAL/PLATELET
Basophils Absolute: 0 K/uL (ref 0.0–0.1)
Basophils Relative: 0.9 % (ref 0.0–3.0)
Eosinophils Absolute: 0.1 K/uL (ref 0.0–0.7)
Eosinophils Relative: 1.5 % (ref 0.0–5.0)
HCT: 42 % (ref 36.0–46.0)
Hemoglobin: 13.9 g/dL (ref 12.0–15.0)
Lymphocytes Relative: 28.8 % (ref 12.0–46.0)
Lymphs Abs: 1.4 K/uL (ref 0.7–4.0)
MCHC: 33.1 g/dL (ref 30.0–36.0)
MCV: 88.2 fl (ref 78.0–100.0)
Monocytes Absolute: 0.6 K/uL (ref 0.1–1.0)
Monocytes Relative: 11.2 % (ref 3.0–12.0)
Neutro Abs: 2.9 K/uL (ref 1.4–7.7)
Neutrophils Relative %: 57.6 % (ref 43.0–77.0)
Platelets: 383 K/uL (ref 150.0–400.0)
RBC: 4.76 Mil/uL (ref 3.87–5.11)
RDW: 13.7 % (ref 11.5–15.5)
WBC: 5 K/uL (ref 4.0–10.5)

## 2024-04-21 LAB — LIPID PANEL
Cholesterol: 220 mg/dL — ABNORMAL HIGH (ref 0–200)
HDL: 44 mg/dL (ref >39.00)
LDL Cholesterol: 146 mg/dL — ABNORMAL HIGH (ref 0–99)
NonHDL: 176.42
Total CHOL/HDL Ratio: 5
Triglycerides: 154 mg/dL — ABNORMAL HIGH (ref 0.0–149.0)
VLDL: 30.8 mg/dL (ref 0.0–40.0)

## 2024-04-21 LAB — VITAMIN D 25 HYDROXY (VIT D DEFICIENCY, FRACTURES): VITD: 60.21 ng/mL (ref 30.00–100.00)

## 2024-04-21 LAB — TSH: TSH: 2.08 u[IU]/mL (ref 0.35–5.50)

## 2024-04-21 LAB — HEMOGLOBIN A1C: Hgb A1c MFr Bld: 4.7 % (ref 4.6–6.5)

## 2024-04-21 NOTE — Progress Notes (Signed)
   Subjective:    Patient ID: Frances Randolph, female    DOB: 10-19-1980, 43 y.o.   MRN: [id number]  HPI CPE- UTD on pap, Tdap, foot exam.  Due for eye exam, microalbumin.  Had mammo w/ GYN ([facility]).  Health Maintenance  Topic Date Due   COVID-19 Vaccine (1) Never done   Diabetic kidney evaluation - Urine ACR  Never done   Pneumococcal Vaccine (1 of 2 - PCV) Never done   Hepatitis B Vaccines 19-59 Average Risk (1 of 3 - 19+ 3-dose series) Never done   HPV VACCINES (1 - 3-dose SCDM series) Never done   Mammogram  Never done   Influenza Vaccine  Never done   OPHTHALMOLOGY EXAM  03/03/2024   HEMOGLOBIN A1C  04/14/2024   Diabetic kidney evaluation - eGFR measurement  10/12/2024   FOOT EXAM  10/12/2024   Cervical Cancer Screening (HPV/Pap Cotest)  05/23/2025   DTaP/Tdap/Td (4 - Td or Tdap) 04/12/2033   Hepatitis C Screening  Completed   HIV Screening  Completed   Meningococcal B Vaccine  Aged Out    Patient Care Team    Relationship Specialty Notifications Start End  Mahlon Comer BRAVO, MD PCP - General   05/22/10   Milas Layton SAILOR, MD Referring Physician Obstetrics  10/23/22   [facility]    12/16/22       Review of Systems Patient reports no vision/ hearing changes, adenopathy,fever, weight change,  persistant/recurrent hoarseness , swallowing issues, chest pain, palpitations, edema, persistant/recurrent cough, hemoptysis, dyspnea (rest/exertional/paroxysmal nocturnal), gastrointestinal bleeding (melena, rectal bleeding), abdominal pain, significant heartburn, bowel changes, GU symptoms (dysuria, hematuria, incontinence), Gyn symptoms (abnormal  bleeding, pain),  syncope, focal weakness, memory loss, numbness & tingling, skin/hair/nail changes, abnormal bruising or bleeding, anxiety, or depression.     Objective:   Physical Exam General Appearance:    Alert, cooperative, no distress, appears stated age  Head:    Normocephalic, without obvious  abnormality, atraumatic  Eyes:    PERRL, conjunctiva/corneas clear, EOM's intact both eyes  Ears:    Normal TM's and external ear canals, both ears  Nose:   Nares normal, septum midline, mucosa normal, no drainage    or sinus tenderness  Throat:   Lips, mucosa, and tongue normal; teeth and gums normal  Neck:   Supple, symmetrical, trachea midline, no adenopathy;    Thyroid : no enlargement/tenderness/nodules  Back:     Symmetric, no curvature, ROM normal, no CVA tenderness  Lungs:     Clear to auscultation bilaterally, respirations unlabored  Chest Wall:    No tenderness or deformity   Heart:    Regular rate and rhythm, S1 and S2 normal, no murmur, rub   or gallop  Breast Exam:    Deferred to GYN  Abdomen:     Soft, non-tender, bowel sounds active all four quadrants,    no masses, no organomegaly  Genitalia:    Deferred to GYN  Rectal:    Extremities:   Extremities normal, atraumatic, no cyanosis or edema  Pulses:   2+ and symmetric all extremities  Skin:   Skin color, texture, turgor normal, no rashes or lesions  Lymph nodes:   Cervical, supraclavicular, and axillary nodes normal  Neurologic:   CNII-XII intact, normal strength, sensation and reflexes    throughout          Assessment & Plan:

## 2024-04-21 NOTE — Assessment & Plan Note (Signed)
 Pt's PE WNL.  UTD on pap, Tdap, mammo.  Referral placed for GI as she is turning 44 and sister just dx'd w/ stage IV colon cancer at age 44.  Pt to schedule eye exam.  Check labs.  Anticipatory guidance provided.

## 2024-04-21 NOTE — Patient Instructions (Addendum)
 Follow up in 3-4 months to recheck sugar We'll notify you of your lab results and make any changes if needed Continue to work on low carb/low sugar diet and regular exercise- you can do it! SCHEDULE your eye exam at your convenience We'll call you to schedule your GI appt for the colonoscopy Call with any questions or concerns Stay Safe!  Stay Healthy! Happy Holidays!

## 2024-04-22 LAB — IRON,TIBC AND FERRITIN PANEL
%SAT: 15 % — ABNORMAL LOW (ref 16–45)
Ferritin: 254 ng/mL — ABNORMAL HIGH (ref 16–232)
Iron: 52 ug/dL (ref 40–190)
TIBC: 336 ug/dL (ref 250–450)

## 2024-04-24 ENCOUNTER — Other Ambulatory Visit: Payer: Self-pay | Admitting: Family Medicine

## 2024-04-24 DIAGNOSIS — E119 Type 2 diabetes mellitus without complications: Secondary | ICD-10-CM

## 2024-04-26 DIAGNOSIS — Z800 Family history of malignant neoplasm of digestive organs: Secondary | ICD-10-CM | POA: Diagnosis not present

## 2024-04-26 DIAGNOSIS — Z1211 Encounter for screening for malignant neoplasm of colon: Secondary | ICD-10-CM | POA: Diagnosis not present

## 2024-05-11 DIAGNOSIS — N939 Abnormal uterine and vaginal bleeding, unspecified: Secondary | ICD-10-CM | POA: Diagnosis not present

## 2024-05-11 DIAGNOSIS — Z862 Personal history of diseases of the blood and blood-forming organs and certain disorders involving the immune mechanism: Secondary | ICD-10-CM | POA: Diagnosis not present

## 2024-05-11 DIAGNOSIS — Z3141 Encounter for fertility testing: Secondary | ICD-10-CM | POA: Diagnosis not present

## 2024-05-11 DIAGNOSIS — F4321 Adjustment disorder with depressed mood: Secondary | ICD-10-CM | POA: Diagnosis not present

## 2024-05-12 ENCOUNTER — Other Ambulatory Visit: Payer: Self-pay | Admitting: Family Medicine

## 2024-05-16 DIAGNOSIS — Z1211 Encounter for screening for malignant neoplasm of colon: Secondary | ICD-10-CM | POA: Diagnosis not present

## 2024-05-16 DIAGNOSIS — K6389 Other specified diseases of intestine: Secondary | ICD-10-CM | POA: Diagnosis not present

## 2024-05-16 DIAGNOSIS — Z800 Family history of malignant neoplasm of digestive organs: Secondary | ICD-10-CM | POA: Diagnosis not present

## 2024-05-16 DIAGNOSIS — Z83719 Family history of colon polyps, unspecified: Secondary | ICD-10-CM | POA: Diagnosis not present

## 2024-07-14 ENCOUNTER — Ambulatory Visit: Admitting: Family Medicine

## 2024-07-14 ENCOUNTER — Encounter: Payer: Self-pay | Admitting: Family Medicine

## 2024-07-14 VITALS — BP 132/80 | HR 112 | Wt 180.4 lb

## 2024-07-14 DIAGNOSIS — R300 Dysuria: Secondary | ICD-10-CM

## 2024-07-14 DIAGNOSIS — N760 Acute vaginitis: Secondary | ICD-10-CM

## 2024-07-14 DIAGNOSIS — E119 Type 2 diabetes mellitus without complications: Secondary | ICD-10-CM

## 2024-07-14 LAB — POCT URINALYSIS DIPSTICK
Bilirubin, UA: NEGATIVE
Blood, UA: NEGATIVE
Glucose, UA: NEGATIVE
Ketones, UA: NEGATIVE
Leukocytes, UA: NEGATIVE
Nitrite, UA: NEGATIVE
Protein, UA: NEGATIVE
Spec Grav, UA: <=1.005 — AB
Urobilinogen, UA: 0.2 U/dL
pH, UA: 6.5

## 2024-07-14 LAB — MICROALBUMIN / CREATININE URINE RATIO
Creatinine,U: 33.8 mg/dL
Microalb Creat Ratio: UNDETERMINED mg/g (ref 0.0–30.0)
Microalb, Ur: <0.7 mg/dL

## 2024-07-14 MED ORDER — FLUCONAZOLE 150 MG PO TABS: 150.0000 mg | ORAL_TABLET | Freq: Once | ORAL | 0 refills | Status: AC

## 2024-07-14 MED ORDER — MOUNJARO 15 MG/0.5ML ~~LOC~~ SOAJ: 15.0000 mg | SUBCUTANEOUS | 1 refills | Status: AC

## 2024-07-14 NOTE — Patient Instructions (Signed)
 Follow up as needed or as scheduled Take the Fluconazole  pill today and then repeat the dose in 3 days Try and avoid any new soaps/detergents/lotions Call with any questions or concerns Stay Safe!  Stay Healthy! CONGRATS!!!!

## 2024-07-14 NOTE — Progress Notes (Signed)
" ° °  Subjective:    Patient ID: Frances Randolph, female    DOB: 1980/11/04, 44 y.o.   MRN: [id number]  HPI Vaginitis- sxs started 10-14 days ago w/ a burning sensation.  Increased irritation w/ wiping.  Had vaginal d/c that started yesterday- white.  No odor or specific texture.     Review of Systems For ROS see HPI     Objective:   Physical Exam Vitals reviewed.  Constitutional:      General: She is not in acute distress.    Appearance: Normal appearance. She is not ill-appearing.  HENT:     Head: Normocephalic and atraumatic.  Eyes:     Extraocular Movements: Extraocular movements intact.     Conjunctiva/sclera: Conjunctivae normal.  Abdominal:     General: There is no distension.     Palpations: Abdomen is soft.     Tenderness: There is no abdominal tenderness. There is no guarding or rebound.  Genitourinary:    Comments: Pt declined exam- elected to self swab Skin:    General: Skin is warm and dry.  Neurological:     General: No focal deficit present.     Mental Status: She is alert and oriented to person, place, and time.  Psychiatric:        Mood and Affect: Mood normal.        Behavior: Behavior normal.        Thought Content: Thought content normal.           Assessment & Plan:  Acute vaginitis- new.  Pt has hx of similar years ago when she tried a new body wash.  Sxs resolved w/ Diflucan  at that time.  UA is not suspicious for UTI and her description of sxs is more consistent w/ yeast.  Start Diflucan  x2.  Will await Aptima swab.  Pt expressed understanding and is in agreement w/ plan.   "

## 2024-07-15 ENCOUNTER — Telehealth: Payer: Self-pay

## 2024-07-15 ENCOUNTER — Other Ambulatory Visit (HOSPITAL_COMMUNITY): Payer: Self-pay

## 2024-07-15 NOTE — Telephone Encounter (Signed)
 Pharmacy Patient Advocate Encounter   Received notification from [employer] that prior authorization for Mounjaro  15MG /0.5ML auto-injectors is required/requested.   Insurance verification completed.   The patient is insured through [employer].   Per test claim: PA required; PA submitted to above mentioned insurance via Latent Key/confirmation #/EOC [id number] Status is pending

## 2024-07-16 LAB — SURESWAB® ADVANCED VAGINITIS PLUS,TMA
C. trachomatis RNA, TMA: NOT DETECTED
CANDIDA SPECIES: NOT DETECTED
Candida glabrata: NOT DETECTED
N. gonorrhoeae RNA, TMA: NOT DETECTED
SURESWAB(R) ADV BACTERIAL VAGINOSIS(BV),TMA: NEGATIVE
TRICHOMONAS VAGINALIS (TV),TMA: NOT DETECTED
